# Patient Record
Sex: Female | Born: 1985 | Race: Black or African American | Hispanic: No | Marital: Single | Smoking: Never smoker
Health system: Southern US, Community
[De-identification: ages and names within clinical notes are randomized; demographics above are authoritative.]

## PROBLEM LIST (undated history)

## (undated) DIAGNOSIS — J45909 Unspecified asthma, uncomplicated: Secondary | ICD-10-CM

## (undated) DIAGNOSIS — E079 Disorder of thyroid, unspecified: Secondary | ICD-10-CM

## (undated) DIAGNOSIS — O009 Unspecified ectopic pregnancy without intrauterine pregnancy: Secondary | ICD-10-CM

## (undated) HISTORY — PX: OTHER SURGICAL HISTORY: SHX169

## (undated) HISTORY — PX: ECTOPIC PREGNANCY SURGERY: SHX613

---

## 2007-02-19 ENCOUNTER — Emergency Department (HOSPITAL_COMMUNITY): Admission: EM | Admit: 2007-02-19 | Discharge: 2007-02-19 | Payer: Self-pay | Admitting: Emergency Medicine

## 2007-03-24 ENCOUNTER — Encounter: Admission: RE | Admit: 2007-03-24 | Discharge: 2007-03-24 | Payer: Self-pay | Admitting: General Surgery

## 2007-03-24 ENCOUNTER — Encounter (INDEPENDENT_AMBULATORY_CARE_PROVIDER_SITE_OTHER): Payer: Self-pay | Admitting: Diagnostic Radiology

## 2007-03-24 ENCOUNTER — Other Ambulatory Visit: Admission: RE | Admit: 2007-03-24 | Discharge: 2007-03-24 | Payer: Self-pay | Admitting: Diagnostic Radiology

## 2008-05-26 ENCOUNTER — Emergency Department (HOSPITAL_COMMUNITY): Admission: EM | Admit: 2008-05-26 | Discharge: 2008-05-27 | Payer: Self-pay | Admitting: Emergency Medicine

## 2008-10-19 ENCOUNTER — Emergency Department (HOSPITAL_COMMUNITY): Admission: EM | Admit: 2008-10-19 | Discharge: 2008-10-19 | Payer: Self-pay | Admitting: Emergency Medicine

## 2008-11-21 ENCOUNTER — Emergency Department (HOSPITAL_COMMUNITY): Admission: EM | Admit: 2008-11-21 | Discharge: 2008-11-21 | Payer: Self-pay | Admitting: Emergency Medicine

## 2009-04-22 ENCOUNTER — Emergency Department (HOSPITAL_COMMUNITY): Admission: EM | Admit: 2009-04-22 | Discharge: 2009-04-22 | Payer: Self-pay | Admitting: Emergency Medicine

## 2009-10-30 ENCOUNTER — Emergency Department (HOSPITAL_COMMUNITY): Admission: EM | Admit: 2009-10-30 | Discharge: 2009-10-30 | Payer: Self-pay | Admitting: Family Medicine

## 2010-03-30 ENCOUNTER — Emergency Department (HOSPITAL_COMMUNITY)
Admission: EM | Admit: 2010-03-30 | Discharge: 2010-03-30 | Payer: Self-pay | Source: Home / Self Care | Admitting: Emergency Medicine

## 2010-06-02 ENCOUNTER — Emergency Department (HOSPITAL_COMMUNITY)
Admission: EM | Admit: 2010-06-02 | Discharge: 2010-06-02 | Disposition: A | Discharge status: Home / Self Care | Payer: No Typology Code available for payment source | Attending: Emergency Medicine | Admitting: Emergency Medicine

## 2010-06-02 DIAGNOSIS — M25519 Pain in unspecified shoulder: Secondary | ICD-10-CM | POA: Insufficient documentation

## 2010-06-02 DIAGNOSIS — IMO0002 Reserved for concepts with insufficient information to code with codable children: Secondary | ICD-10-CM | POA: Insufficient documentation

## 2010-06-02 DIAGNOSIS — R110 Nausea: Secondary | ICD-10-CM | POA: Insufficient documentation

## 2010-06-02 DIAGNOSIS — Y9289 Other specified places as the place of occurrence of the external cause: Secondary | ICD-10-CM | POA: Insufficient documentation

## 2010-10-15 ENCOUNTER — Emergency Department (HOSPITAL_COMMUNITY)
Admission: EM | Admit: 2010-10-15 | Discharge: 2010-10-15 | Discharge status: Against Medical Advice | Payer: Self-pay | Attending: Emergency Medicine | Admitting: Emergency Medicine

## 2011-07-09 ENCOUNTER — Emergency Department (HOSPITAL_COMMUNITY): Payer: Medicaid Other

## 2011-07-09 ENCOUNTER — Encounter (HOSPITAL_COMMUNITY): Payer: Self-pay | Admitting: *Deleted

## 2011-07-09 ENCOUNTER — Emergency Department (HOSPITAL_COMMUNITY)
Admission: EM | Admit: 2011-07-09 | Discharge: 2011-07-09 | Disposition: A | Discharge status: Home / Self Care | Payer: Medicaid Other | Attending: Emergency Medicine | Admitting: Emergency Medicine

## 2011-07-09 DIAGNOSIS — R079 Chest pain, unspecified: Secondary | ICD-10-CM | POA: Insufficient documentation

## 2011-07-09 DIAGNOSIS — E079 Disorder of thyroid, unspecified: Secondary | ICD-10-CM | POA: Insufficient documentation

## 2011-07-09 DIAGNOSIS — M940 Chondrocostal junction syndrome [Tietze]: Secondary | ICD-10-CM | POA: Insufficient documentation

## 2011-07-09 HISTORY — DX: Disorder of thyroid, unspecified: E07.9

## 2011-07-09 MED ORDER — NAPROXEN 500 MG PO TABS: 500.0000 mg | ORAL_TABLET | Freq: Two times a day (BID) | ORAL | Status: DC

## 2011-07-09 MED ORDER — IBUPROFEN 800 MG PO TABS
800.0000 mg | ORAL_TABLET | Freq: Once | ORAL | Status: AC
  Administered 2011-07-09: 800 mg via ORAL
  Filled 2011-07-09: qty 1

## 2011-07-09 MED ORDER — OXYCODONE-ACETAMINOPHEN 5-325 MG PO TABS: 1.0000 | ORAL_TABLET | ORAL | Status: AC | PRN

## 2011-07-09 NOTE — ED Notes (Signed)
Pt was sitting on couch last nite and started feeling pains in chest.  Pt has pain with moving around.  Pt has tenderness to right chest.  No coughing or lifting

## 2011-07-09 NOTE — ED Provider Notes (Signed)
History  This chart was scribed for Dione Booze, MD found by Bennett Scrape. This patient was seen in room STRE6/STRE6 and the patient's care was started at 12:25PM.  CSN: [id number]  Arrival date & time 07/09/11  0948   None     Chief Complaint  Patient presents with  . Chest Pain     The history is provided by the patient. No language interpreter was used.    Kaylee Walter is a 26 y.o. female who presents to the Emergency Department complaining of approximately 14 hours of gradual onset, non-changing, constant, non-radiating mid sternal area chest pains described as sharp. The pain is worse with deep breathes and lifting of arms. The pain is a 4 currently. She did not take any medications at home to improve symptoms. She denies any prior episodes. She denies any recent long travels or h/o DVTs/PEs. She is currently on a "birth control patch". She denies nausea, emesis, diaphoresis and SOB. She has a h/o thyroid disease.   She ha no PCP. She is on medicaid.   Past Medical History  Diagnosis Date  . Thyroid disease     Past Surgical History  Procedure Date  . Other surgical history     c-section  . Ectopic pregnancy surgery     No family history on file.  History  Substance Use Topics  . Smoking status: Never Smoker   . Smokeless tobacco: Not on file  . Alcohol Use: Yes     occ     Review of Systems  Constitutional: Negative for fever and diaphoresis.  Respiratory: Negative for shortness of breath.   Cardiovascular: Positive for chest pain. Negative for palpitations.  Gastrointestinal: Negative for nausea and vomiting.  Neurological: Negative for weakness.    Allergies  Review of patient's allergies indicates no known allergies.  Home Medications   Current Outpatient Rx  Name Route Sig Dispense Refill  . NORELGESTROMIN-ETH ESTRADIOL 150-20 MCG/24HR TD PTWK Transdermal Place 1 patch onto the skin once a week.      Triage Vitals: BP 112/85  Pulse  71  Temp(Src) 97.9 F (36.6 C) (Oral)  Resp 20  SpO2 95%  LMP 07/09/2011  Physical Exam  Nursing note and vitals reviewed. Constitutional: She is oriented to person, place, and time. She appears well-developed and well-nourished. No distress.  HENT:  Head: Normocephalic and atraumatic.  Eyes: EOM are normal.  Neck: Neck supple. No tracheal deviation present.  Cardiovascular: Normal rate.   Pulmonary/Chest: Effort normal. No respiratory distress. She exhibits tenderness.       Moderate tenderness to the upper right parasternal upon palpation  Musculoskeletal: Normal range of motion.  Neurological: She is alert and oriented to person, place, and time.  Skin: Skin is warm and dry.  Psychiatric: She has a normal mood and affect. Her behavior is normal.    ED Course  Procedures (including critical care time)  DIAGNOSTIC STUDIES: Oxygen Saturation is 95% on room air, adequate by my interpretation.    COORDINATION OF CARE: 12:29PM-Discussed negative radiology studies with pt and pt acknowledged the results. Discussed naproxen, tylenol, percocet and ice as discharge treatment plan with pt and pt agreed. Advised pt that the percocet will make her drowsy and constipate her.   Labs Reviewed - No data to display Dg Chest 2 View  07/09/2011  *RADIOLOGY REPORT*  Clinical Data: Chest pain  CHEST - 2 VIEW  Comparison: 11/21/2008  Findings: The heart size and mediastinal contours are within normal limits.  Both lungs are clear.  The visualized skeletal structures are unremarkable.  IMPRESSION: Negative examination.  Original Report Authenticated By: Rosealee Albee, M.D.     1. Costochondritis       MDM  Chest pain which seems most likely musculoskeletal.      I personally performed the services described in this documentation, which was scribed in my presence. The recorded information has been reviewed and considered.      Dione Booze, MD 07/14/11 [phone]

## 2011-07-09 NOTE — Discharge Instructions (Signed)
Costochondritis Costochondritis (Tietze syndrome), or costochondral separation, is a swelling and irritation (inflammation) of the tissue (cartilage) that connects your ribs with your breastbone (sternum). It may occur on its own (spontaneously), through damage caused by an accident (trauma), or simply from coughing or minor exercise. It may take up to 6 weeks to get better and longer if you are unable to be conservative in your activities. HOME CARE INSTRUCTIONS   Avoid exhausting physical activity. Try not to strain your ribs during normal activity. This would include any activities using chest, belly (abdominal), and side muscles, especially if heavy weights are used.   Use ice for 15 to 20 minutes per hour while awake for the first 2 days. Place the ice in a plastic bag, and place a towel between the bag of ice and your skin.   Only take over-the-counter or prescription medicines for pain, discomfort, or fever as directed by your caregiver.  SEEK IMMEDIATE MEDICAL CARE IF:   Your pain increases or you are very uncomfortable.   You have a fever.   You develop difficulty with your breathing.   You cough up blood.   You develop worse chest pains, shortness of breath, sweating, or vomiting.   You develop new, unexplained problems (symptoms).  MAKE SURE YOU:   Understand these instructions.   Will watch your condition.   Will get help right away if you are not doing well or get worse.  Document Released: 11/28/2004 Document Revised: 02/07/2011 Document Reviewed: 10/07/2007 [facility] Patient Information 2012 [place], [place].  Naproxen and naproxen sodium oral immediate-release tablets What is this medicine? NAPROXEN (na PROX en) is a non-steroidal anti-inflammatory drug (NSAID). It is used to reduce swelling and to treat pain. This medicine may be used for dental pain, headache, or painful monthly periods. It is also used for painful joint and muscular problems such as arthritis,  tendinitis, bursitis, and gout. This medicine may be used for other purposes; ask your health care provider or pharmacist if you have questions. What should I tell my health care provider before I take this medicine? They need to know if you have any of these conditions: -asthma -cigarette smoker -drink more than 3 alcohol containing drinks a day -heart disease or circulation problems such as heart failure or leg edema (fluid retention) -high blood pressure -kidney disease -liver disease -stomach bleeding or ulcers -an unusual or allergic reaction to naproxen, aspirin, other NSAIDs, other medicines, foods, dyes, or preservatives -pregnant or trying to get pregnant -breast-feeding How should I use this medicine? Take this medicine by mouth with a glass of water. Follow the directions on the prescription label. Take it with food if your stomach gets upset. Try to not lie down for at least 10 minutes after you take it. Take your medicine at regular intervals. Do not take your medicine more often than directed. Long-term, continuous use may increase the risk of heart attack or stroke. A special MedGuide will be given to you by the pharmacist with each prescription and refill. Be sure to read this information carefully each time. Talk to your pediatrician regarding the use of this medicine in children. Special care may be needed. Overdosage: If you think you have taken too much of this medicine contact a poison control center or emergency room at once. NOTE: This medicine is only for you. Do not share this medicine with others. What if I miss a dose? If you miss a dose, take it as soon as you can. If it  is almost time for your next dose, take only that dose. Do not take double or extra doses. What may interact with this medicine? -alcohol -aspirin -cidofovir -diuretics -lithium -methotrexate -other drugs for inflammation like ketorolac or prednisone -pemetrexed -probenecid -warfarin This  list may not describe all possible interactions. Give your health care provider a list of all the medicines, herbs, non-prescription drugs, or dietary supplements you use. Also tell them if you smoke, drink alcohol, or use illegal drugs. Some items may interact with your medicine. What should I watch for while using this medicine? Tell your doctor or health care professional if your pain does not get better. Talk to your doctor before taking another medicine for pain. Do not treat yourself. This medicine does not prevent heart attack or stroke. In fact, this medicine may increase the chance of a heart attack or stroke. The chance may increase with longer use of this medicine and in people who have heart disease. If you take aspirin to prevent heart attack or stroke, talk with your doctor or health care professional. Do not take other medicines that contain aspirin, ibuprofen, or naproxen with this medicine. Side effects such as stomach upset, nausea, or ulcers may be more likely to occur. Many medicines available without a prescription should not be taken with this medicine. This medicine can cause ulcers and bleeding in the stomach and intestines at any time during treatment. Do not smoke cigarettes or drink alcohol. These increase irritation to your stomach and can make it more susceptible to damage from this medicine. Ulcers and bleeding can happen without warning symptoms and can cause death. You may get drowsy or dizzy. Do not drive, use machinery, or do anything that needs mental alertness until you know how this medicine affects you. Do not stand or sit up quickly, especially if you are an older patient. This reduces the risk of dizzy or fainting spells. This medicine can cause you to bleed more easily. Try to avoid damage to your teeth and gums when you brush or floss your teeth. What side effects may I notice from receiving this medicine? Side effects that you should report to your doctor or health  care professional as soon as possible: -black or bloody stools, blood in the urine or vomit -blurred vision -chest pain -difficulty breathing or wheezing -nausea or vomiting -severe stomach pain -skin rash, skin redness, blistering or peeling skin, hives, or itching -slurred speech or weakness on one side of the body -swelling of eyelids, throat, lips -unexplained weight gain or swelling -unusually weak or tired -yellowing of eyes or skin Side effects that usually do not require medical attention (report to your doctor or health care professional if they continue or are bothersome): -constipation -headache -heartburn This list may not describe all possible side effects. Call your doctor for medical advice about side effects. You may report side effects to FDA at 1-800-FDA-1088. Where should I keep my medicine? Keep out of the reach of children. Store at room temperature between 15 and 30 degrees C (59 and 86 degrees F). Keep container tightly closed. Throw away any unused medicine after the expiration date. NOTE: This sheet is a summary. It may not cover all possible information. If you have questions about this medicine, talk to your doctor, pharmacist, or health care provider.  2012, Elsevier/Gold Standard. (02/20/2009 8:10:16 PM)  Acetaminophen; Oxycodone tablets What is this medicine? ACETAMINOPHEN; OXYCODONE (a set a MEE noe fen; ox i KOE done) is a pain reliever. It is  used to treat mild to moderate pain. This medicine may be used for other purposes; ask your health care provider or pharmacist if you have questions. What should I tell my health care provider before I take this medicine? They need to know if you have any of these conditions: -brain tumor -Crohn's disease, inflammatory bowel disease, or ulcerative colitis -drink more than 3 alcohol containing drinks per day -drug abuse or addiction -head injury -heart or circulation problems -kidney disease or problems going  to the bathroom -liver disease -lung disease, asthma, or breathing problems -an unusual or allergic reaction to acetaminophen, oxycodone, other opioid analgesics, other medicines, foods, dyes, or preservatives -pregnant or trying to get pregnant -breast-feeding How should I use this medicine? Take this medicine by mouth with a full glass of water. Follow the directions on the prescription label. Take your medicine at regular intervals. Do not take your medicine more often than directed. Talk to your pediatrician regarding the use of this medicine in children. Special care may be needed. Patients over 60 years old may have a stronger reaction and need a smaller dose. Overdosage: If you think you have taken too much of this medicine contact a poison control center or emergency room at once. NOTE: This medicine is only for you. Do not share this medicine with others. What if I miss a dose? If you miss a dose, take it as soon as you can. If it is almost time for your next dose, take only that dose. Do not take double or extra doses. What may interact with this medicine? -alcohol or medicines that contain alcohol -antihistamines -barbiturates like amobarbital, butalbital, butabarbital, methohexital, pentobarbital, phenobarbital, thiopental, and secobarbital -benztropine -drugs for bladder problems like solifenacin, trospium, oxybutynin, tolterodine, hyoscyamine, and methscopolamine -drugs for breathing problems like ipratropium and tiotropium -drugs for certain stomach or intestine problems like propantheline, homatropine methylbromide, glycopyrrolate, atropine, belladonna, and dicyclomine -general anesthetics like etomidate, ketamine, nitrous oxide, propofol, desflurane, enflurane, halothane, isoflurane, and sevoflurane -medicines for depression, anxiety, or psychotic disturbances -medicines for pain like codeine, morphine, pentazocine, buprenorphine, butorphanol, nalbuphine, tramadol, and  propoxyphene -medicines for sleep -muscle relaxants -naltrexone -phenothiazines like perphenazine, thioridazine, chlorpromazine, mesoridazine, fluphenazine, prochlorperazine, promazine, and trifluoperazine -scopolamine -trihexyphenidyl This list may not describe all possible interactions. Give your health care provider a list of all the medicines, herbs, non-prescription drugs, or dietary supplements you use. Also tell them if you smoke, drink alcohol, or use illegal drugs. Some items may interact with your medicine. What should I watch for while using this medicine? Tell your doctor or health care professional if your pain does not go away, if it gets worse, or if you have new or a different type of pain. You may develop tolerance to the medicine. Tolerance means that you will need a higher dose of the medication for pain relief. Tolerance is normal and is expected if you take this medicine for a long time. Do not suddenly stop taking your medicine because you may develop a severe reaction. Your body becomes used to the medicine. This does NOT mean you are addicted. Addiction is a behavior related to getting and using a drug for a nonmedical reason. If you have pain, you have a medical reason to take pain medicine. Your doctor will tell you how much medicine to take. If your doctor wants you to stop the medicine, the dose will be slowly lowered over time to avoid any side effects. You may get drowsy or dizzy. Do not drive, use machinery,  or do anything that needs mental alertness until you know how this medicine affects you. Do not stand or sit up quickly, especially if you are an older patient. This reduces the risk of dizzy or fainting spells. Alcohol may interfere with the effect of this medicine. Avoid alcoholic drinks. The medicine will cause constipation. Try to have a bowel movement at least every 2 to 3 days. If you do not have a bowel movement for 3 days, call your doctor or health care  professional. Do not take Tylenol (acetaminophen) or medicines that have acetaminophen with this medicine. Too much acetaminophen can be very dangerous. Many nonprescription medicines contain acetaminophen. Always read the labels carefully to avoid taking more acetaminophen. What side effects may I notice from receiving this medicine? Side effects that you should report to your doctor or health care professional as soon as possible: -allergic reactions like skin rash, itching or hives, swelling of the face, lips, or tongue -breathing difficulties, wheezing -confusion -light headedness or fainting spells -severe stomach pain -yellowing of the skin or the whites of the eyes Side effects that usually do not require medical attention (report to your doctor or health care professional if they continue or are bothersome): -dizziness -drowsiness -nausea -vomiting This list may not describe all possible side effects. Call your doctor for medical advice about side effects. You may report side effects to FDA at 1-800-FDA-1088. Where should I keep my medicine? Keep out of the reach of children. This medicine can be abused. Keep your medicine in a safe place to protect it from theft. Do not share this medicine with anyone. Selling or giving away this medicine is dangerous and against the law. Store at room temperature between 20 and 25 degrees C (68 and 77 degrees F). Keep container tightly closed. Protect from light. Flush any unused medicines down the toilet. Do not use the medicine after the expiration date. NOTE: This sheet is a summary. It may not cover all possible information. If you have questions about this medicine, talk to your doctor, pharmacist, or health care provider.  2012, [employer]vier/Gold Standard. (01/18/2008 10:01:21 AM)

## 2011-11-10 ENCOUNTER — Encounter (HOSPITAL_COMMUNITY): Payer: Self-pay

## 2011-11-10 ENCOUNTER — Emergency Department (HOSPITAL_COMMUNITY)
Admission: EM | Admit: 2011-11-10 | Discharge: 2011-11-10 | Disposition: A | Discharge status: Home / Self Care | Payer: Medicaid Other | Source: Home / Self Care | Attending: Emergency Medicine | Admitting: Emergency Medicine

## 2011-11-10 DIAGNOSIS — L259 Unspecified contact dermatitis, unspecified cause: Secondary | ICD-10-CM

## 2011-11-10 HISTORY — DX: Unspecified ectopic pregnancy without intrauterine pregnancy: O00.90

## 2011-11-10 MED ORDER — BETAMETHASONE VALERATE 0.12 % EX FOAM: 1.0000 "application " | Freq: Two times a day (BID) | CUTANEOUS | Status: DC

## 2011-11-10 MED ORDER — METHYLPREDNISOLONE 4 MG PO KIT: PACK | ORAL | Status: AC

## 2011-11-10 MED ORDER — LORATADINE 10 MG PO TABS: 10.0000 mg | ORAL_TABLET | Freq: Every day | ORAL | Status: DC

## 2011-11-10 NOTE — ED Notes (Signed)
Pt used conditioner on her hair today and now has swelling and rash of her face and scalp.

## 2011-11-13 NOTE — ED Provider Notes (Signed)
History     CSN: [id number]  Arrival date & time 11/10/11  1508   First MD Initiated Contact with Patient 11/10/11 1537      Chief Complaint  Patient presents with  . Dermatitis    (Consider location/radiation/quality/duration/timing/severity/associated sxs/prior treatment) HPI Comments: Patient states that she is a new conditioner on her hair earlier today, now reports dry, irritated skin, burning pain, swelling, itching along entire scalp and on her hairline.   Patient is a 26 y.o. female presenting with rash. The history is provided by the patient. No language interpreter was used.  Rash  This is a new problem. The current episode started 3 to 5 hours ago. The problem has not changed since onset.The problem is associated with chemical exposure. There has been no fever. The rash is present on the scalp. Associated symptoms include itching and pain. Pertinent negatives include no blisters and no weeping. She has tried nothing for the symptoms. The treatment provided no relief. Risk factors include new environmental exposures.    Past Medical History  Diagnosis Date  . Thyroid disease   . Ectopic pregnancy     Past Surgical History  Procedure Date  . Other surgical history     c-section  . Ectopic pregnancy surgery   . Cesarean section     History reviewed. No pertinent family history.  History  Substance Use Topics  . Smoking status: Never Smoker   . Smokeless tobacco: Not on file  . Alcohol Use: Yes     occ    OB History    Grav Para Term Preterm Abortions TAB SAB Ect Mult Living                  Review of Systems  Skin: Positive for itching and rash.    Allergies  Review of patient's allergies indicates no known allergies.  Home Medications   Current Outpatient Rx  Name Route Sig Dispense Refill  . BETAMETHASONE VALERATE 0.12 % EX FOAM Topical Apply 1 application topically 2 (two) times daily. Until symptoms resolve 100 g 0  . LORATADINE 10 MG PO TABS  Oral Take 1 tablet (10 mg total) by mouth daily. 10 tablet 0  . METHYLPREDNISOLONE 4 MG PO KIT  follow package directions 21 tablet 0    BP 129/81  Pulse 82  Temp 98 F (36.7 C) (Oral)  Resp 18  SpO2 96%  LMP 10/21/2011  Physical Exam  Nursing note and vitals reviewed. Constitutional: She is oriented to person, place, and time. She appears well-developed and well-nourished. No distress.  HENT:  Head: Normocephalic and atraumatic.  Eyes: Conjunctivae normal and EOM are normal.  Neck: Normal range of motion.  Cardiovascular: Normal rate.   Pulmonary/Chest: Effort normal.  Abdominal: She exhibits no distension.  Musculoskeletal: Normal range of motion.  Lymphadenopathy:    She has no cervical adenopathy.  Neurological: She is alert and oriented to person, place, and time. Coordination normal.  Skin: Skin is warm and dry. Rash noted.       Diffusely tender, erythematous, irritated scalp. No signs of infection  Psychiatric: She has a normal mood and affect. Her behavior is normal. Judgment and thought content normal.    ED Course  Procedures (including critical care time)  Labs Reviewed - No data to display No results found.   1. Contact dermatitis     MDM  No signs of infection. Topical steroids for scalp, nonsedating antihistamines, Medrol Dosepak. Discussed signs and symptoms that should  prompt return to the department. Patient agrees with plan  Luiz Blare, MD 11/13/11 [phone]

## 2011-11-19 ENCOUNTER — Encounter (HOSPITAL_COMMUNITY): Payer: Self-pay | Admitting: Emergency Medicine

## 2011-11-19 ENCOUNTER — Emergency Department (HOSPITAL_COMMUNITY)
Admission: EM | Admit: 2011-11-19 | Discharge: 2011-11-19 | Disposition: A | Discharge status: Home / Self Care | Payer: Self-pay | Attending: Emergency Medicine | Admitting: Emergency Medicine

## 2011-11-19 ENCOUNTER — Emergency Department (HOSPITAL_COMMUNITY): Payer: Self-pay

## 2011-11-19 DIAGNOSIS — Z3490 Encounter for supervision of normal pregnancy, unspecified, unspecified trimester: Secondary | ICD-10-CM

## 2011-11-19 DIAGNOSIS — J069 Acute upper respiratory infection, unspecified: Secondary | ICD-10-CM | POA: Insufficient documentation

## 2011-11-19 DIAGNOSIS — E079 Disorder of thyroid, unspecified: Secondary | ICD-10-CM | POA: Insufficient documentation

## 2011-11-19 DIAGNOSIS — Z3201 Encounter for pregnancy test, result positive: Secondary | ICD-10-CM | POA: Insufficient documentation

## 2011-11-19 DIAGNOSIS — B9789 Other viral agents as the cause of diseases classified elsewhere: Secondary | ICD-10-CM | POA: Insufficient documentation

## 2011-11-19 MED ORDER — PRENATAL COMPLETE 14-0.4 MG PO TABS: 1.0000 | ORAL_TABLET | Freq: Every day | ORAL | Status: DC

## 2011-11-19 NOTE — ED Notes (Signed)
Pt here for cold symptoms, and cough, sorethroat, congestion and runy nose. sts she also had a light period last cycle and may be pregnant.

## 2011-11-19 NOTE — ED Notes (Signed)
Pt c/o URI and cough with cold and productive cough with yellow sputum; pt sts pain in chest with cough; pt sts started yesterday with sore throat also

## 2011-11-19 NOTE — ED Provider Notes (Signed)
History  This chart was scribed for Joya Gaskins, MD by Ladona Ridgel Day. This patient was seen in room TR08C/TR08C and the patient's care was started at 0910.   CSN: [id number]  Arrival date & time 11/19/11  0910   First MD Initiated Contact with Patient 11/19/11 1103      Chief Complaint  Patient presents with  . URI  . Cough   Patient is a 26 y.o. female presenting with cough. The history is provided by the patient. No language interpreter was used.  Cough This is a new problem. The current episode started yesterday. The problem occurs constantly. The problem has been gradually worsening. The cough is productive of sputum. There has been no fever. Associated symptoms include sore throat and myalgias. Pertinent negatives include no shortness of breath. Treatments tried: mucin ex. The treatment provided no relief. She is not a smoker.   Kaylee Walter is a 27 y.o. female who presents to the Emergency Department complaining of constant gradually worsening productive cough with yellow sputum which began yesterday. She also complains of emesis, body aches, dizziness, and a sore throat. Tried taking mucin ex without any relief. She has no abdominal pain or vaginal bleeding.  Past Medical History  Diagnosis Date  . Thyroid disease   . Ectopic pregnancy     Past Surgical History  Procedure Date  . Other surgical history     c-section  . Ectopic pregnancy surgery   . Cesarean section     History reviewed. No pertinent family history.  History  Substance Use Topics  . Smoking status: Never Smoker   . Smokeless tobacco: Not on file  . Alcohol Use: Yes     occ    OB History    Grav Para Term Preterm Abortions TAB SAB Ect Mult Living                  Review of Systems  Constitutional: Negative for fever.  HENT: Positive for sore throat.   Respiratory: Positive for cough and chest tightness. Negative for shortness of breath.   Gastrointestinal: Positive for nausea and  vomiting. Negative for abdominal pain and diarrhea.  Genitourinary: Negative for vaginal bleeding.  Musculoskeletal: Positive for myalgias.  Neurological: Positive for dizziness.    Allergies  Review of patient's allergies indicates no known allergies.  Home Medications  No current outpatient prescriptions on file.  Triage Vitals: BP 116/68  Pulse 111  Temp 98.3 F (36.8 C) (Oral)  Resp 20  SpO2 99%  LMP 10/21/2011  Physical Exam CONSTITUTIONAL: Well developed/well nourished HEAD AND FACE: Normocephalic/atraumatic EYES: EOMI/PERRL ENMT: Mucous membranes moist, uvula midline, voice normal, left TM/right TM normal NECK: supple no meningeal signs SPINE:entire spine nontender CV: S1/S2 noted, no murmurs/rubs/gallops noted LUNGS: Lungs are clear to auscultation bilaterally, no apparent distress ABDOMEN: soft, nontender, no rebound or guarding GU:no cva tenderness NEURO: Pt is awake/alert, moves all extremitiesx4 EXTREMITIES: pulses normal, full ROM SKIN: warm, color normal PSYCH: no abnormalities of mood noted ED Course  Procedures  DIAGNOSTIC STUDIES: Oxygen Saturation is 99% on room air, normal by my interpretation.    COORDINATION OF CARE: At 1115 AM Discussed treatment plan with patient which includes CXR, and UA. Patient agrees.   Labs Reviewed  POCT PREGNANCY, URINE - Abnormal; Notable for the following:    Preg Test, Ur POSITIVE (*)     All other components within normal limits   Dg Chest 2 View  11/19/2011  *RADIOLOGY REPORT*  Clinical  Data: Chest pain, cough, congestion  CHEST - 2 VIEW  Comparison: 07/09/2011  Findings: Cardiomediastinal silhouette is stable.  No acute infiltrate or pleural effusion.  No pulmonary edema.  Bony thorax is stable.  IMPRESSION: No active disease.   Original Report Authenticated By: Natasha Mead, M.D.     Pt advised to use tylenol   Prenatal vitamins ordered Likely has viral uri.     MDM  Nursing notes including past medical  history and social history reviewed and considered in documentation xrays reviewed and considered    I personally performed the services described in this documentation, which was scribed in my presence. The recorded information has been reviewed and considered.           Joya Gaskins, MD 11/19/11 [phone]

## 2012-10-06 ENCOUNTER — Encounter (HOSPITAL_COMMUNITY): Payer: Self-pay | Admitting: Emergency Medicine

## 2012-10-06 ENCOUNTER — Emergency Department (HOSPITAL_COMMUNITY): Payer: BC Managed Care – PPO

## 2012-10-06 ENCOUNTER — Emergency Department (HOSPITAL_COMMUNITY)
Admission: EM | Admit: 2012-10-06 | Discharge: 2012-10-06 | Disposition: A | Discharge status: Home / Self Care | Payer: BC Managed Care – PPO | Attending: Emergency Medicine | Admitting: Emergency Medicine

## 2012-10-06 DIAGNOSIS — M545 Low back pain, unspecified: Secondary | ICD-10-CM

## 2012-10-06 DIAGNOSIS — E079 Disorder of thyroid, unspecified: Secondary | ICD-10-CM | POA: Insufficient documentation

## 2012-10-06 DIAGNOSIS — Y9389 Activity, other specified: Secondary | ICD-10-CM | POA: Insufficient documentation

## 2012-10-06 DIAGNOSIS — Z79899 Other long term (current) drug therapy: Secondary | ICD-10-CM | POA: Insufficient documentation

## 2012-10-06 DIAGNOSIS — Z3202 Encounter for pregnancy test, result negative: Secondary | ICD-10-CM | POA: Insufficient documentation

## 2012-10-06 DIAGNOSIS — Z8742 Personal history of other diseases of the female genital tract: Secondary | ICD-10-CM | POA: Insufficient documentation

## 2012-10-06 DIAGNOSIS — Y92410 Unspecified street and highway as the place of occurrence of the external cause: Secondary | ICD-10-CM | POA: Insufficient documentation

## 2012-10-06 LAB — URINALYSIS, ROUTINE W REFLEX MICROSCOPIC
Glucose, UA: NEGATIVE mg/dL
Hgb urine dipstick: NEGATIVE
Ketones, ur: 15 mg/dL — AB
Specific Gravity, Urine: 1.031 — ABNORMAL HIGH (ref 1.005–1.030)

## 2012-10-06 LAB — URINE MICROSCOPIC-ADD ON

## 2012-10-06 MED ORDER — HYDROCODONE-ACETAMINOPHEN 5-325 MG PO TABS: 1.0000 | ORAL_TABLET | Freq: Four times a day (QID) | ORAL | Status: DC | PRN

## 2012-10-06 MED ORDER — IBUPROFEN 800 MG PO TABS: 800.0000 mg | ORAL_TABLET | Freq: Three times a day (TID) | ORAL | Status: DC

## 2012-10-06 MED ORDER — CYCLOBENZAPRINE HCL 10 MG PO TABS: 10.0000 mg | ORAL_TABLET | Freq: Two times a day (BID) | ORAL | Status: DC | PRN
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
## 2012-10-08 LAB — URINE CULTURE

## 2012-10-09 NOTE — ED Provider Notes (Signed)
Medical screening examination/treatment/procedure(s) were performed by non-physician practitioner and as supervising physician I was immediately available for consultation/collaboration.   Jerre Vandrunen W. Evalisse Prajapati, MD 10/09/12 2225 

## 2012-11-13 ENCOUNTER — Other Ambulatory Visit: Payer: Self-pay | Admitting: Obstetrics and Gynecology

## 2012-11-13 ENCOUNTER — Other Ambulatory Visit (HOSPITAL_COMMUNITY)
Admission: RE | Admit: 2012-11-13 | Discharge: 2012-11-13 | Disposition: A | Discharge status: Home / Self Care | Payer: BC Managed Care – PPO | Source: Ambulatory Visit | Attending: Obstetrics and Gynecology | Admitting: Obstetrics and Gynecology

## 2012-11-13 DIAGNOSIS — Z01419 Encounter for gynecological examination (general) (routine) without abnormal findings: Secondary | ICD-10-CM | POA: Insufficient documentation

## 2013-06-09 ENCOUNTER — Emergency Department (HOSPITAL_COMMUNITY)
Admission: EM | Admit: 2013-06-09 | Discharge: 2013-06-09 | Disposition: A | Discharge status: Home / Self Care | Payer: BC Managed Care – PPO | Attending: Emergency Medicine | Admitting: Emergency Medicine

## 2013-06-09 ENCOUNTER — Encounter (HOSPITAL_COMMUNITY): Payer: Self-pay | Admitting: Emergency Medicine

## 2013-06-09 ENCOUNTER — Emergency Department (HOSPITAL_COMMUNITY): Payer: BC Managed Care – PPO

## 2013-06-09 DIAGNOSIS — J45901 Unspecified asthma with (acute) exacerbation: Secondary | ICD-10-CM | POA: Insufficient documentation

## 2013-06-09 DIAGNOSIS — R5383 Other fatigue: Secondary | ICD-10-CM

## 2013-06-09 DIAGNOSIS — R0789 Other chest pain: Secondary | ICD-10-CM | POA: Insufficient documentation

## 2013-06-09 DIAGNOSIS — J45909 Unspecified asthma, uncomplicated: Secondary | ICD-10-CM

## 2013-06-09 DIAGNOSIS — Z8639 Personal history of other endocrine, nutritional and metabolic disease: Secondary | ICD-10-CM | POA: Insufficient documentation

## 2013-06-09 DIAGNOSIS — R5381 Other malaise: Secondary | ICD-10-CM | POA: Insufficient documentation

## 2013-06-09 DIAGNOSIS — Z862 Personal history of diseases of the blood and blood-forming organs and certain disorders involving the immune mechanism: Secondary | ICD-10-CM | POA: Insufficient documentation

## 2013-06-09 DIAGNOSIS — Z79899 Other long term (current) drug therapy: Secondary | ICD-10-CM | POA: Insufficient documentation

## 2013-06-09 DIAGNOSIS — Z791 Long term (current) use of non-steroidal anti-inflammatories (NSAID): Secondary | ICD-10-CM | POA: Insufficient documentation

## 2013-06-09 DIAGNOSIS — R42 Dizziness and giddiness: Secondary | ICD-10-CM | POA: Insufficient documentation

## 2013-06-09 DIAGNOSIS — J22 Unspecified acute lower respiratory infection: Secondary | ICD-10-CM

## 2013-06-09 HISTORY — DX: Unspecified asthma, uncomplicated: J45.909

## 2013-06-09 MED ORDER — PREDNISONE 20 MG PO TABS: 40.0000 mg | ORAL_TABLET | Freq: Every day | ORAL | Status: DC

## 2013-06-09 MED ORDER — ALBUTEROL SULFATE (2.5 MG/3ML) 0.083% IN NEBU
5.0000 mg | INHALATION_SOLUTION | Freq: Once | RESPIRATORY_TRACT | Status: AC
  Administered 2013-06-09: 5 mg via RESPIRATORY_TRACT
  Filled 2013-06-09: qty 6

## 2013-06-09 MED ORDER — BUDESONIDE 180 MCG/ACT IN AEPB: 1.0000 | INHALATION_SPRAY | Freq: Two times a day (BID) | RESPIRATORY_TRACT | Status: DC

## 2013-06-09 MED ORDER — IPRATROPIUM BROMIDE 0.02 % IN SOLN
0.5000 mg | Freq: Once | RESPIRATORY_TRACT | Status: AC
  Administered 2013-06-09: 0.5 mg via RESPIRATORY_TRACT
  Filled 2013-06-09: qty 2.5

## 2013-06-09 MED ORDER — ALBUTEROL SULFATE HFA 108 (90 BASE) MCG/ACT IN AERS: 1.0000 | INHALATION_SPRAY | RESPIRATORY_TRACT | Status: DC | PRN

## 2013-06-09 NOTE — ED Notes (Signed)
Pt states she has asthma.  Pt states she has been having coughing spells to the point she makes herself dizzy.  Pt states she took 2 puffs of her albuterol inhaler with a little relief.  Pt with dry cough noted in triage.  Pt states she also took allergy medicine today.

## 2013-06-09 NOTE — Discharge Instructions (Signed)
Begin using your pulmicort inhaler for asthma prevention after you finish your course of prednisone. Make sure to wash your mouth out with water when you are finished using the pulmicort every time.    Read the instructions below on reasons to return to the emergency department and to learn more about your diagnosis.  Use over the counter medications for symptomatic relief as we discussed (musinex as a decongestant, Tylenol for fever/pain, Motrin/Ibuprofen for muscle aches). If prescribed a cough suppressant during your visit, do not operate heavy machinery with in 5 hours of taking this medication. Followup with your primary care doctor in 4 days if your symptoms persist.  Your more than welcome to return to the emergency department if symptoms worsen or become concerning.  Upper Respiratory Infection, Adult  An upper respiratory infection (URI) is also sometimes known as the common cold. Most people improve within 1 week, but symptoms can last up to 2 weeks. A residual cough may last even longer.   URI is most commonly caused by a virus. Viruses are NOT treated with antibiotics. You can easily spread the virus to others by oral contact. This includes kissing, sharing a glass, coughing, or sneezing. Touching your mouth or nose and then touching a surface, which is then touched by another person, can also spread the virus.   TREATMENT  Treatment is directed at relieving symptoms. There is no cure. Antibiotics are not effective, because the infection is caused by a virus, not by bacteria. Treatment may include:  Increased fluid intake. Sports drinks offer valuable electrolytes, sugars, and fluids.  Breathing heated mist or steam (vaporizer or shower).  Eating chicken soup or other clear broths, and maintaining good nutrition.  Getting plenty of rest.  Using gargles or lozenges for comfort.  Controlling fevers with ibuprofen or acetaminophen as directed by your caregiver.  Increasing usage of your  inhaler if you have asthma.  Return to work when your temperature has returned to normal.   SEEK MEDICAL CARE IF:  After the first few days, you feel you are getting worse rather than better.  You develop worsening shortness of breath, or brown or red sputum. These may be signs of pneumonia.  You develop yellow or brown nasal discharge or pain in the face, especially when you bend forward. These may be signs of sinusitis.  You develop a fever, swollen neck glands, pain with swallowing, or white areas in the back of your throat. These may be signs of strep throat.    Bronchitis Bronchitis is inflammation of the airways that extend from the windpipe into the lungs (bronchi). The inflammation often causes mucus to develop, which leads to a cough. If the inflammation becomes severe, it may cause shortness of breath. CAUSES  Bronchitis may be caused by:   Viral infections.   Bacteria.   Cigarette smoke.   Allergens, pollutants, and other irritants.  SIGNS AND SYMPTOMS  The most common symptom of bronchitis is a frequent cough that produces mucus. Other symptoms include:  Fever.   Body aches.   Chest congestion.   Chills.   Shortness of breath.   Sore throat.  DIAGNOSIS  Bronchitis is usually diagnosed through a medical history and physical exam. Tests, such as chest X-rays, are sometimes done to rule out other conditions.  TREATMENT  You may need to avoid contact with whatever caused the problem (smoking, for example). Medicines are sometimes needed. These may include:  Antibiotics. These may be prescribed if the condition is caused  by bacteria.  Cough suppressants. These may be prescribed for relief of cough symptoms.   Inhaled medicines. These may be prescribed to help open your airways and make it easier for you to breathe.   Steroid medicines. These may be prescribed for those with recurrent (chronic) bronchitis. HOME CARE INSTRUCTIONS  Get plenty of rest.    Drink enough fluids to keep your urine clear or pale yellow (unless you have a medical condition that requires fluid restriction). Increasing fluids may help thin your secretions and will prevent dehydration.   Only take over-the-counter or prescription medicines as directed by your health care provider.  Only take antibiotics as directed. Make sure you finish them even if you start to feel better.  Avoid secondhand smoke, irritating chemicals, and strong fumes. These will make bronchitis worse. If you are a smoker, quit smoking. Consider using nicotine gum or skin patches to help control withdrawal symptoms. Quitting smoking will help your lungs heal faster.   Put a cool-mist humidifier in your bedroom at night to moisten the air. This may help loosen mucus. Change the water in the humidifier daily. You can also run the hot water in your shower and sit in the bathroom with the door closed for 5 10 minutes.   Follow up with your health care provider as directed.   Wash your hands frequently to avoid catching bronchitis again or spreading an infection to others.  SEEK MEDICAL CARE IF: Your symptoms do not improve after 1 week of treatment.  SEEK IMMEDIATE MEDICAL CARE IF:  Your fever increases.  You have chills.   You have chest pain.   You have worsening shortness of breath.   You have bloody sputum.  You faint.  You have lightheadedness.  You have a severe headache.   You vomit repeatedly. MAKE SURE YOU:   Understand these instructions.  Will watch your condition.  Will get help right away if you are not doing well or get worse. Document Released: 02/18/2005 Document Revised: 12/09/2012 Document Reviewed: 10/13/2012 [facility] Patient Information 2014 [place], [place].  Asthma Attack Prevention Although there is no way to prevent asthma from starting, you can take steps to control the disease and reduce its symptoms. Learn about your asthma and how to  control it. Take an active role to control your asthma by working with your health care provider to create and follow an asthma action plan. An asthma action plan guides you in:  Taking your medicines properly.  Avoiding things that set off your asthma or make your asthma worse (asthma triggers).  Tracking your level of asthma control.  Responding to worsening asthma.  Seeking emergency care when needed. To track your asthma, keep records of your symptoms, check your peak flow number using a handheld device that shows how well air moves out of your lungs (peak flow meter), and get regular asthma checkups.  WHAT ARE SOME WAYS TO PREVENT AN ASTHMA ATTACK?  Take medicines as directed by your health care provider.  Keep track of your asthma symptoms and level of control.  With your health care provider, write a detailed plan for taking medicines and managing an asthma attack. Then be sure to follow your action plan. Asthma is an ongoing condition that needs regular monitoring and treatment.  Identify and avoid asthma triggers. Many outdoor allergens and irritants (such as pollen, mold, cold air, and air pollution) can trigger asthma attacks. Find out what your asthma triggers are and take steps to avoid them.  Monitor your breathing. Learn to recognize warning signs of an attack, such as coughing, wheezing, or shortness of breath. Your lung function may decrease before you notice any signs or symptoms, so regularly measure and record your peak airflow with a home peak flow meter.  Identify and treat attacks early. If you act quickly, you are less likely to have a severe attack. You will also need less medicine to control your symptoms. When your peak flow measurements decrease and alert you to an upcoming attack, take your medicine as instructed and immediately stop any activity that may have triggered the attack. If your symptoms do not improve, get medical help.  Pay attention to increasing  quick-relief inhaler use. If you find yourself relying on your quick-relief inhaler, your asthma is not under control. See your health care provider about adjusting your treatment. WHAT CAN MAKE MY SYMPTOMS WORSE? A number of common things can set off or make your asthma symptoms worse and cause temporary increased inflammation of your airways. Keep track of your asthma symptoms for several weeks, detailing all the environmental and emotional factors that are linked with your asthma. When you have an asthma attack, go back to your asthma diary to see which factor, or combination of factors, might have contributed to it. Once you know what these factors are, you can take steps to control many of them. If you have allergies and asthma, it is important to take asthma prevention steps at home. Minimizing contact with the substance to which you are allergic will help prevent an asthma attack. Some triggers and ways to avoid these triggers are: Animal Dander:  Some people are allergic to the flakes of skin or dried saliva from animals with fur or feathers.   There is no such thing as a hypoallergenic dog or cat breed. All dogs or cats can cause allergies, even if they don't shed.  Keep these pets out of your home.  If you are not able to keep a pet outdoors, keep the pet out of your bedroom and other sleeping areas at all times, and keep the door closed.  Remove carpets and furniture covered with cloth from your home. If that is not possible, keep the pet away from fabric-covered furniture and carpets. Dust Mites: Many people with asthma are allergic to dust mites. Dust mites are tiny bugs that are found in every home in mattresses, pillows, carpets, fabric-covered furniture, bedcovers, clothes, stuffed toys, and other fabric-covered items.   Cover your mattress in a special dust-proof cover.  Cover your pillow in a special dust-proof cover, or wash the pillow each week in hot water. Water must be hotter  than 130 F (54.4 C) to kill dust mites. Cold or warm water used with detergent and bleach can also be effective.  Wash the sheets and blankets on your bed each week in hot water.  Try not to sleep or lie on cloth-covered cushions.  Call ahead when traveling and ask for a smoke-free hotel room. Bring your own bedding and pillows in case the hotel only supplies feather pillows and down comforters, which may contain dust mites and cause asthma symptoms.  Remove carpets from your bedroom and those laid on concrete, if you can.  Keep stuffed toys out of the bed, or wash the toys weekly in hot water or cooler water with detergent and bleach. Cockroaches: Many people with asthma are allergic to the droppings and remains of cockroaches.   Keep food and garbage in closed containers.  Never leave food out.  Use poison baits, traps, powders, gels, or paste (for example, boric acid).  If a spray is used to kill cockroaches, stay out of the room until the odor goes away. Indoor Mold:  Fix leaky faucets, pipes, or other sources of water that have mold around them.  Clean floors and moldy surfaces with a fungicide or diluted bleach.  Avoid using humidifiers, vaporizers, or swamp coolers. These can spread molds through the air. Pollen and Outdoor Mold:  When pollen or mold spore counts are high, try to keep your windows closed.  Stay indoors with windows closed from late morning to afternoon. Pollen and some mold spore counts are highest at that time.  Ask your health care provider whether you need to take anti-inflammatory medicine or increase your dose of the medicine before your allergy season starts. Other Irritants to Avoid:  Tobacco smoke is an irritant. If you smoke, ask your health care provider how you can quit. Ask family members to quit smoking too. Do not allow smoking in your home or car.  If possible, do not use a wood-burning stove, kerosene heater, or fireplace. Minimize  exposure to all sources of smoke, including to incense, candles, fires, and fireworks.  Try to stay away from strong odors and sprays, such as perfume, talcum powder, hair spray, and paints.  Decrease humidity in your home and use an indoor air cleaning device. Reduce indoor humidity to below 60%. Dehumidifiers or central air conditioners can do this.  Decrease house dust exposure by changing furnace and air cooler filters frequently.  Try to have someone else vacuum for you once or twice a week. Stay out of rooms while they are being vacuumed and for a short while afterward.  If you vacuum, use a dust mask from a hardware store, a double-layered or microfilter vacuum cleaner bag, or a vacuum cleaner with a HEPA filter.  Sulfites in foods and beverages can be irritants. Do not drink beer or wine or eat dried fruit, processed potatoes, or shrimp if they cause asthma symptoms.  Cold air can trigger an asthma attack. Cover your nose and mouth with a scarf on cold or windy days.  Several health conditions can make asthma more difficult to manage, including a runny nose, sinus infections, reflux disease, psychological stress, and sleep apnea. Work with your health care provider to manage these conditions.  Avoid close contact with people who have a respiratory infection such as a cold or the flu, since your asthma symptoms may get worse if you catch the infection. Wash your hands thoroughly after touching items that may have been handled by people with a respiratory infection.  Get a flu shot every year to protect against the flu virus, which often makes asthma worse for days or weeks. Also get a pneumonia shot if you have not previously had one. Unlike the flu shot, the pneumonia shot does not need to be given yearly. Medicines:  Talk to your health care provider about whether it is safe for you to take aspirin or non-steroidal anti-inflammatory medicines (NSAIDs). In a small number of people with  asthma, aspirin and NSAIDs can cause asthma attacks. These medicines must be avoided by people who have known aspirin-sensitive asthma. It is important that people with aspirin-sensitive asthma read labels of all over-the-counter medicines used to treat pain, colds, coughs, and fever.  Beta blockers and ACE inhibitors are other medicines you should discuss with your health care provider. HOW CAN I FIND  OUT WHAT I AM ALLERGIC TO? Ask your asthma health care provider about allergy skin testing or blood testing (the RAST test) to identify the allergens to which you are sensitive. If you are found to have allergies, the most important thing to do is to try to avoid exposure to any allergens that you are sensitive to as much as possible. Other treatments for allergies, such as medicines and allergy shots (immunotherapy) are available.  CAN I EXERCISE? Follow your health care provider's advice regarding asthma treatment before exercising. It is important to maintain a regular exercise program, but vigorous exercise, or exercise in cold, humid, or dry environments can cause asthma attacks, especially for those people who have exercise-induced asthma. Document Released: 02/06/2009 Document Revised: 10/21/2012 Document Reviewed: 08/26/2012 [facility] Patient Information 2014 [place], [place].

## 2013-06-09 NOTE — ED Notes (Signed)
Pt comfortable with discharge and follow up instructions. Prescriptions x3. 

## 2013-06-09 NOTE — ED Provider Notes (Signed)
CSN: [id number]     Arrival date & time 06/09/13  1657 History  This chart was scribed for non-physician practitioner, Arthor Captain, PA-C, working with Toy Baker, MD by Charline Bills, ED Scribe. This patient was seen in room TR10C/TR10C and the patient's care was started at 6:39 PM.     Chief Complaint  Patient presents with  . Cough  . Sore Throat    The history is provided by the patient. No language interpreter was used.   HPI Comments: Kaylee Walter is a 28 y.o. female, with a history of asthma, who presents to the Emergency Department complaining of cough. Pt reports associated SOB and chest tightness onset last night. Pt also reports associated chills, dizziness and weakness onset last night. She also reports sore throat onset earlier today. Pt last took Albuterol 3 hours ago. No sick contacts.   Past Medical History  Diagnosis Date  . Thyroid disease   . Ectopic pregnancy   . Asthma    Past Surgical History  Procedure Laterality Date  . Other surgical history      c-section  . Ectopic pregnancy surgery    . Cesarean section     No family history on file. History  Substance Use Topics  . Smoking status: Never Smoker   . Smokeless tobacco: Not on file  . Alcohol Use: Yes     Comment: occ   OB History   Grav Para Term Preterm Abortions TAB SAB Ect Mult Living                 Review of Systems  Constitutional: Positive for chills.  HENT: Positive for sore throat.   Respiratory: Positive for cough, chest tightness and shortness of breath.   Neurological: Positive for dizziness and weakness.  All other systems reviewed and are negative.   Allergies  Review of patient's allergies indicates no known allergies.  Home Medications   Current Outpatient Rx  Name  Route  Sig  Dispense  Refill  . albuterol (PROVENTIL HFA;VENTOLIN HFA) 108 (90 BASE) MCG/ACT inhaler   Inhalation   Inhale 2 puffs into the lungs every 6 (six) hours as needed for  wheezing.         Marland Kitchen ibuprofen (ADVIL,MOTRIN) 800 MG tablet   Oral   Take 1 tablet (800 mg total) by mouth 3 (three) times daily.   21 tablet   0   . norgestimate-ethinyl estradiol (ORTHO-CYCLEN,SPRINTEC,PREVIFEM) 0.25-35 MG-MCG tablet   Oral   Take 1 tablet by mouth daily.          Triage Vitals: BP 133/77  Pulse 99  Temp(Src) 98.2 F (36.8 C) (Oral)  Resp 22  Wt 157 lb (71.215 kg)  SpO2 94%  LMP 05/02/2013 Physical Exam  Nursing note and vitals reviewed. Constitutional: She is oriented to person, place, and time. She appears well-developed and well-nourished.  HENT:  Head: Normocephalic and atraumatic.  Right Ear: External ear normal.  Left Ear: External ear normal.  Mouth/Throat: Oropharynx is clear and moist.  Eyes: Conjunctivae and EOM are normal.  Neck: Normal range of motion. Neck supple.  Cardiovascular: Normal rate, normal heart sounds and intact distal pulses.   Pulmonary/Chest: Effort normal. She has wheezes.  Abdominal: Soft. Bowel sounds are normal. There is no tenderness. There is no rebound.  Musculoskeletal: Normal range of motion.  Neurological: She is alert and oriented to person, place, and time.  Skin: Skin is warm.    ED Course  Procedures (including  critical care time) DIAGNOSTIC STUDIES: Oxygen Saturation is 94% on RA, adequate by my interpretation.    COORDINATION OF CARE: 6:44 PM-Discussed treatment plan which includes CXR with pt at bedside and pt agreed to plan.   Labs Review Labs Reviewed - No data to display Imaging Review No results found.   EKG Interpretation None      MDM   Final diagnoses:  Chest cold  RAD (reactive airway disease)   Pt CXR negative for acute infiltrate. Patients symptoms are consistent with URI, likely viral etiology. Discussed that antibiotics are not indicated for viral infections. Pt will be discharged with symptomatic treatment.  Verbalizes understanding and is agreeable with plan. Pt is  hemodynamically stable & in NAD prior to dc.   I personally performed the services described in this documentation, which was scribed in my presence. The recorded information has been reviewed and is accurate.     Arthor CaptainAbigail Chaela Branscum, PA-C 06/15/13 2253

## 2013-06-18 NOTE — ED Provider Notes (Signed)
Medical screening examination/treatment/procedure(s) were performed by non-physician practitioner and as supervising physician I was immediately available for consultation/collaboration.   EKG Interpretation None       Darci Lykins T Delonta Yohannes, MD 06/18/13 1821 

## 2014-11-20 ENCOUNTER — Emergency Department (HOSPITAL_COMMUNITY): Payer: 59

## 2014-11-20 ENCOUNTER — Emergency Department (HOSPITAL_COMMUNITY)
Admission: EM | Admit: 2014-11-20 | Discharge: 2014-11-20 | Disposition: A | Discharge status: Home / Self Care | Payer: 59 | Attending: Emergency Medicine | Admitting: Emergency Medicine

## 2014-11-20 ENCOUNTER — Encounter (HOSPITAL_COMMUNITY): Payer: Self-pay | Admitting: *Deleted

## 2014-11-20 DIAGNOSIS — Z79899 Other long term (current) drug therapy: Secondary | ICD-10-CM | POA: Insufficient documentation

## 2014-11-20 DIAGNOSIS — J988 Other specified respiratory disorders: Secondary | ICD-10-CM

## 2014-11-20 DIAGNOSIS — Z7952 Long term (current) use of systemic steroids: Secondary | ICD-10-CM | POA: Diagnosis not present

## 2014-11-20 DIAGNOSIS — J45901 Unspecified asthma with (acute) exacerbation: Secondary | ICD-10-CM | POA: Diagnosis not present

## 2014-11-20 DIAGNOSIS — Z8639 Personal history of other endocrine, nutritional and metabolic disease: Secondary | ICD-10-CM | POA: Diagnosis not present

## 2014-11-20 DIAGNOSIS — Z793 Long term (current) use of hormonal contraceptives: Secondary | ICD-10-CM | POA: Insufficient documentation

## 2014-11-20 DIAGNOSIS — R0981 Nasal congestion: Secondary | ICD-10-CM | POA: Diagnosis present

## 2014-11-20 MED ORDER — IPRATROPIUM-ALBUTEROL 0.5-2.5 (3) MG/3ML IN SOLN
3.0000 mL | Freq: Once | RESPIRATORY_TRACT | Status: AC
  Administered 2014-11-20: 3 mL via RESPIRATORY_TRACT
  Filled 2014-11-20: qty 3

## 2014-11-20 MED ORDER — SALINE SPRAY 0.65 % NA SOLN
1.0000 | Freq: Once | NASAL | Status: AC
  Administered 2014-11-20: 1 via NASAL
  Filled 2014-11-20: qty 44

## 2014-11-20 MED ORDER — BENZONATATE 100 MG PO CAPS: 100.0000 mg | ORAL_CAPSULE | Freq: Three times a day (TID) | ORAL | Status: DC

## 2014-11-20 MED ORDER — PREDNISONE 20 MG PO TABS
40.0000 mg | ORAL_TABLET | Freq: Once | ORAL | Status: AC
  Administered 2014-11-20: 40 mg via ORAL
  Filled 2014-11-20: qty 2

## 2014-11-20 MED ORDER — PREDNISONE 20 MG PO TABS: 40.0000 mg | ORAL_TABLET | Freq: Every day | ORAL | Status: DC

## 2014-11-20 NOTE — ED Notes (Signed)
pt states cough brings up yellow/green sputum.

## 2014-11-20 NOTE — ED Notes (Signed)
Pt c/o cough x 2 weeks, chest and nasal congestion. Pt states she has taken mucinex and inhaler with relief.

## 2014-11-20 NOTE — Discharge Instructions (Signed)
Please follow up with your primary care physician in 1-2 days. If you do not have one please call the [facility] and wellness [facility] number listed above. Please read all discharge instructions and return precautions.    Viral Infections A viral infection can be caused by different types of viruses.Most viral infections are not serious and resolve on their own. However, some infections may cause severe symptoms and may lead to further complications. SYMPTOMS Viruses can frequently cause:  Minor sore throat.  Aches and pains.  Headaches.  Runny nose.  Different types of rashes.  Watery eyes.  Tiredness.  Cough.  Loss of appetite.  Gastrointestinal infections, resulting in nausea, vomiting, and diarrhea. These symptoms do not respond to antibiotics because the infection is not caused by bacteria. However, you might catch a bacterial infection following the viral infection. This is sometimes called a "superinfection." Symptoms of such a bacterial infection may include:  Worsening sore throat with pus and difficulty swallowing.  Swollen neck glands.  Chills and a high or persistent fever.  Severe headache.  Tenderness over the sinuses.  Persistent overall ill feeling (malaise), muscle aches, and tiredness (fatigue).  Persistent cough.  Yellow, green, or brown mucus production with coughing. HOME CARE INSTRUCTIONS   Only take over-the-counter or prescription medicines for pain, discomfort, diarrhea, or fever as directed by your caregiver.  Drink enough water and fluids to keep your urine clear or pale yellow. Sports drinks can provide valuable electrolytes, sugars, and hydration.  Get plenty of rest and maintain proper nutrition. Soups and broths with crackers or rice are fine. SEEK IMMEDIATE MEDICAL CARE IF:   You have severe headaches, shortness of breath, chest pain, neck pain, or an unusual rash.  You have uncontrolled vomiting, diarrhea, or you are unable to  keep down fluids.  You or your child has an oral temperature above 102 F (38.9 C), not controlled by medicine.  Your baby is older than 3 months with a rectal temperature of 102 F (38.9 C) or higher.  Your baby is 24 months old or younger with a rectal temperature of 100.4 F (38 C) or higher. MAKE SURE YOU:   Understand these instructions.  Will watch your condition.  Will get help right away if you are not doing well or get worse. Document Released: 11/28/2004 Document Revised: 05/13/2011 Document Reviewed: 06/25/2010 [facility] Patient Information 2015 [place], [place]. This information is not intended to replace advice given to you by your health care provider. Make sure you discuss any questions you have with your health care provider. Asthma Asthma is a recurring condition in which the airways tighten and narrow. Asthma can make it difficult to breathe. It can cause coughing, wheezing, and shortness of breath. Asthma episodes, also called asthma attacks, range from minor to life-threatening. Asthma cannot be cured, but medicines and lifestyle changes can help control it. CAUSES Asthma is believed to be caused by inherited (genetic) and environmental factors, but its exact cause is unknown. Asthma may be triggered by allergens, lung infections, or irritants in the air. Asthma triggers are different for each person. Common triggers include:   Animal dander.  Dust mites.  Cockroaches.  Pollen from trees or grass.  Mold.  Smoke.  Air pollutants such as dust, household cleaners, hair sprays, aerosol sprays, paint fumes, strong chemicals, or strong odors.  Cold air, weather changes, and winds (which increase molds and pollens in the air).  Strong emotional expressions such as crying or laughing hard.  Stress.  Certain medicines (such as aspirin) or types of drugs (such as beta-blockers).  Sulfites in foods and drinks. Foods and drinks that may contain sulfites include  dried fruit, potato chips, and sparkling grape juice.  Infections or inflammatory conditions such as the flu, a cold, or an inflammation of the nasal membranes (rhinitis).  Gastroesophageal reflux disease (GERD).  Exercise or strenuous activity. SYMPTOMS Symptoms may occur immediately after asthma is triggered or many hours later. Symptoms include:  Wheezing.  Excessive nighttime or early morning coughing.  Frequent or severe coughing with a common cold.  Chest tightness.  Shortness of breath. DIAGNOSIS  The diagnosis of asthma is made by a review of your medical history and a physical exam. Tests may also be performed. These may include:  Lung function studies. These tests show how much air you breathe in and out.  Allergy tests.  Imaging tests such as X-rays. TREATMENT  Asthma cannot be cured, but it can usually be controlled. Treatment involves identifying and avoiding your asthma triggers. It also involves medicines. There are 2 classes of medicine used for asthma treatment:   Controller medicines. These prevent asthma symptoms from occurring. They are usually taken every day.  Reliever or rescue medicines. These quickly relieve asthma symptoms. They are used as needed and provide short-term relief. Your health care provider will help you create an asthma action plan. An asthma action plan is a written plan for managing and treating your asthma attacks. It includes a list of your asthma triggers and how they may be avoided. It also includes information on when medicines should be taken and when their dosage should be changed. An action plan may also involve the use of a device called a peak flow meter. A peak flow meter measures how well the lungs are working. It helps you monitor your condition. HOME CARE INSTRUCTIONS   Take medicines only as directed by your health care provider. Speak with your health care provider if you have questions about how or when to take the  medicines.  Use a peak flow meter as directed by your health care provider. Record and keep track of readings.  Understand and use the action plan to help minimize or stop an asthma attack without needing to seek medical care.  Control your home environment in the following ways to help prevent asthma attacks:  Do not smoke. Avoid being exposed to secondhand smoke.  Change your heating and air conditioning filter regularly.  Limit your use of fireplaces and wood stoves.  Get rid of pests (such as roaches and mice) and their droppings.  Throw away plants if you see mold on them.  Clean your floors and dust regularly. Use unscented cleaning products.  Try to have someone else vacuum for you regularly. Stay out of rooms while they are being vacuumed and for a short while afterward. If you vacuum, use a dust mask from a hardware store, a double-layered or microfilter vacuum cleaner bag, or a vacuum cleaner with a HEPA filter.  Replace carpet with wood, tile, or vinyl flooring. Carpet can trap dander and dust.  Use allergy-proof pillows, mattress covers, and box spring covers.  Wash bed sheets and blankets every week in hot water and dry them in a dryer.  Use blankets that are made of polyester or cotton.  Clean bathrooms and kitchens with bleach. If possible, have someone repaint the walls in these rooms with mold-resistant paint. Keep out of the rooms that are being cleaned and painted.  Wash hands frequently. SEEK MEDICAL CARE IF:   You have wheezing, shortness of breath, or a cough even if taking medicine to prevent attacks.  The colored mucus you cough up (sputum) is thicker than usual.  Your sputum changes from clear or white to yellow, green, gray, or bloody.  You have any problems that may be related to the medicines you are taking (such as a rash, itching, swelling, or trouble breathing).  You are using a reliever medicine more than 2-3 times per week.  Your peak flow  is still at 50-79% of your personal best after following your action plan for 1 hour.  You have a fever. SEEK IMMEDIATE MEDICAL CARE IF:   You seem to be getting worse and are unresponsive to treatment during an asthma attack.  You are short of breath even at rest.  You get short of breath when doing very little physical activity.  You have difficulty eating, drinking, or talking due to asthma symptoms.  You develop chest pain.  You develop a fast heartbeat.  You have a bluish color to your lips or fingernails.  You are light-headed, dizzy, or faint.  Your peak flow is less than 50% of your personal best. MAKE SURE YOU:   Understand these instructions.  Will watch your condition.  Will get help right away if you are not doing well or get worse. Document Released: 02/18/2005 Document Revised: 07/05/2013 Document Reviewed: 09/17/2012 [facility] Patient Information 2015 [place], [place]. This information is not intended to replace advice given to you by your health care provider. Make sure you discuss any questions you have with your health care provider.

## 2014-11-20 NOTE — ED Provider Notes (Signed)
CSN: [id number]     Arrival date & time 11/20/14  2018 History   First MD Initiated Contact with Patient 11/20/14 2035     Chief Complaint  Patient presents with  . Nasal Congestion  . Shortness of Breath  . Cough     (Consider location/radiation/quality/duration/timing/severity/associated sxs/prior Treatment) HPI Comments: Patient is a 29 year old past medical history significant for asthma female presenting to the urgency department for evaluation of 2 weeks of productive cough with green mucus, nasal congestion, chest congestion. Patient states she started feeling short of breath over the last 1-2 days. She states she has been trying her inhaler as prescribed with little to no improvement of her symptoms. She states she last used her inhaler just prior to arrival with some relief of wheezing. Denies any history of hospitalizations for asthma exacerbation. No sick contacts noted.   Past Medical History  Diagnosis Date  . Thyroid disease   . Ectopic pregnancy   . Asthma    Past Surgical History  Procedure Laterality Date  . Other surgical history      c-section  . Ectopic pregnancy surgery    . Cesarean section     No family history on file. Social History  Substance Use Topics  . Smoking status: Never Smoker   . Smokeless tobacco: None  . Alcohol Use: Yes     Comment: occ   OB History    No data available     Review of Systems  HENT: Positive for congestion and rhinorrhea.   Respiratory: Positive for cough and shortness of breath.   All other systems reviewed and are negative.     Allergies  Review of patient's allergies indicates no known allergies.  Home Medications   Prior to Admission medications   Medication Sig Start Date End Date Taking? Authorizing Provider  albuterol (PROVENTIL HFA;VENTOLIN HFA) 108 (90 BASE) MCG/ACT inhaler Inhale 2 puffs into the lungs every 6 (six) hours as needed for wheezing.    Historical Provider, MD  albuterol (PROVENTIL  HFA;VENTOLIN HFA) 108 (90 BASE) MCG/ACT inhaler Inhale 1-2 puffs into the lungs every 4 (four) hours as needed for wheezing or shortness of breath. 06/09/13   Arthor Captain, PA-C  benzonatate (TESSALON) 100 MG capsule Take 1 capsule (100 mg total) by mouth every 8 (eight) hours. 11/20/14   Keeton Kassebaum, PA-C  budesonide (PULMICORT FLEXHALER) 180 MCG/ACT inhaler Inhale 1 puff into the lungs 2 (two) times daily. 06/09/13   Arthor Captain, PA-C  ibuprofen (ADVIL,MOTRIN) 800 MG tablet Take 1 tablet (800 mg total) by mouth 3 (three) times daily. 10/06/12   Felicie Morn, NP  norgestimate-ethinyl estradiol (ORTHO-CYCLEN,SPRINTEC,PREVIFEM) 0.25-35 MG-MCG tablet Take 1 tablet by mouth daily.    Historical Provider, MD  predniSONE (DELTASONE) 20 MG tablet Take 2 tablets (40 mg total) by mouth daily. 06/09/13   Arthor Captain, PA-C  predniSONE (DELTASONE) 20 MG tablet Take 2 tablets (40 mg total) by mouth daily. 11/20/14   Rhenda Oregon, PA-C   BP 114/72 mmHg  Pulse 70  Temp(Src) 97.5 F (36.4 C) (Oral)  Resp 18  Ht  (1.626 m)  Wt 159 lb (72.122 kg)  BMI 27.28 kg/m2  SpO2 98%  LMP 11/20/2014 Physical Exam  Constitutional: She is oriented to person, place, and time. She appears well-developed and well-nourished. No distress.  HENT:  Head: Normocephalic and atraumatic.  Right Ear: External ear normal.  Left Ear: External ear normal.  Mouth/Throat: Oropharynx is clear and moist.  Nasal congestion, rhinorrhea.  Eyes: Conjunctivae are normal.  Neck: Neck supple.  Cardiovascular: Normal rate, regular rhythm and normal heart sounds.   Pulmonary/Chest: Effort normal. No accessory muscle usage. She has wheezes (Slight). She exhibits no tenderness.  Abdominal: Soft. There is no tenderness.  Musculoskeletal: Normal range of motion. She exhibits no edema.  Neurological: She is alert and oriented to person, place, and time.  Skin: Skin is warm and dry. She is not diaphoretic.  Nursing note and  vitals reviewed.   ED Course  Procedures (including critical care time) Medications  sodium chloride (OCEAN) 0.65 % nasal spray 1 spray (1 spray Each Nare Given 11/20/14 2139)  ipratropium-albuterol (DUONEB) 0.5-2.5 (3) MG/3ML nebulizer solution 3 mL (3 mLs Nebulization Given 11/20/14 2118)  predniSONE (DELTASONE) tablet 40 mg (40 mg Oral Given 11/20/14 2257)    Labs Review Labs Reviewed - No data to display  Imaging Review Dg Chest 2 View  11/20/2014   CLINICAL DATA:  Cough for 2 weeks, chest and nasal congestion, shortness of breath, asthma  EXAM: CHEST  2 VIEW  COMPARISON:  06/09/2013  FINDINGS: Normal heart size, mediastinal contours, and pulmonary vascularity.  Minimal peribronchial thickening.  Lungs otherwise clear.  No pleural effusion or pneumothorax.  No acute bony abnormalities.  IMPRESSION: Minimal peribronchial thickening which could reflect asthma or bronchitis.  No acute infiltrate.   Electronically Signed   By: Ulyses Southward M.D.   On: 11/20/2014 22:35   I have personally reviewed and evaluated these images and lab results as part of my medical decision-making.   EKG Interpretation None      MDM   Final diagnoses:  Wheezing-associated respiratory infection    Afebrile, NAD, non-toxic appearing, AAOx4.   Pt CXR negative for acute infiltrate. Patients symptoms are consistent with URI, likely viral etiology. Discussed that antibiotics are not indicated for viral infections. Breathing improved after duoneb administration. Lungs clear to auscultation on re-evaluation. Pt will be discharged with symptomatic treatment.  Verbalizes understanding and is agreeable with plan. Pt is hemodynamically stable & in NAD prior to dc.    Francee Piccolo, PA-C 11/21/14 0000  Richardean Canal, MD 11/21/14 [phone]

## 2014-11-20 NOTE — ED Notes (Signed)
Patient transported to CT 

## 2014-11-20 NOTE — ED Notes (Signed)
Pt returned to room E43 from radiology

## 2015-08-07 DIAGNOSIS — A599 Trichomoniasis, unspecified: Secondary | ICD-10-CM | POA: Diagnosis not present

## 2015-08-07 DIAGNOSIS — N760 Acute vaginitis: Secondary | ICD-10-CM | POA: Diagnosis not present

## 2015-08-08 DIAGNOSIS — A599 Trichomoniasis, unspecified: Secondary | ICD-10-CM | POA: Diagnosis not present

## 2015-11-22 ENCOUNTER — Encounter (HOSPITAL_COMMUNITY): Payer: Self-pay | Admitting: Emergency Medicine

## 2015-11-22 ENCOUNTER — Emergency Department (HOSPITAL_COMMUNITY): Payer: BLUE CROSS/BLUE SHIELD

## 2015-11-22 ENCOUNTER — Inpatient Hospital Stay (HOSPITAL_COMMUNITY)
Admission: EM | Admit: 2015-11-22 | Discharge: 2015-11-24 | DRG: 202 | Disposition: A | Discharge status: Home / Self Care | Payer: BLUE CROSS/BLUE SHIELD | Attending: Internal Medicine | Admitting: Internal Medicine

## 2015-11-22 DIAGNOSIS — J9601 Acute respiratory failure with hypoxia: Secondary | ICD-10-CM | POA: Diagnosis present

## 2015-11-22 DIAGNOSIS — J45901 Unspecified asthma with (acute) exacerbation: Secondary | ICD-10-CM | POA: Diagnosis present

## 2015-11-22 DIAGNOSIS — Z8249 Family history of ischemic heart disease and other diseases of the circulatory system: Secondary | ICD-10-CM

## 2015-11-22 DIAGNOSIS — E876 Hypokalemia: Secondary | ICD-10-CM | POA: Diagnosis present

## 2015-11-22 DIAGNOSIS — R0602 Shortness of breath: Secondary | ICD-10-CM | POA: Diagnosis not present

## 2015-11-22 DIAGNOSIS — R000 Tachycardia, unspecified: Secondary | ICD-10-CM | POA: Diagnosis present

## 2015-11-22 DIAGNOSIS — E079 Disorder of thyroid, unspecified: Secondary | ICD-10-CM | POA: Diagnosis present

## 2015-11-22 DIAGNOSIS — J705 Respiratory conditions due to smoke inhalation: Secondary | ICD-10-CM | POA: Diagnosis present

## 2015-11-22 DIAGNOSIS — X088XXA Exposure to other specified smoke, fire and flames, initial encounter: Secondary | ICD-10-CM | POA: Diagnosis present

## 2015-11-22 DIAGNOSIS — T59811A Toxic effect of smoke, accidental (unintentional), initial encounter: Secondary | ICD-10-CM | POA: Diagnosis present

## 2015-11-22 MED ORDER — ALBUTEROL SULFATE (2.5 MG/3ML) 0.083% IN NEBU
5.0000 mg | INHALATION_SOLUTION | Freq: Once | RESPIRATORY_TRACT | Status: AC
  Administered 2015-11-22: 5 mg via RESPIRATORY_TRACT
  Filled 2015-11-22: qty 6

## 2015-11-22 MED ORDER — MAGNESIUM SULFATE 2 GM/50ML IV SOLN
2.0000 g | Freq: Once | INTRAVENOUS | Status: AC
  Administered 2015-11-23: 2 g via INTRAVENOUS
  Filled 2015-11-22: qty 50

## 2015-11-22 MED ORDER — ALBUTEROL (5 MG/ML) CONTINUOUS INHALATION SOLN
10.0000 mg/h | INHALATION_SOLUTION | RESPIRATORY_TRACT | Status: DC
  Administered 2015-11-22: 10 mg/h via RESPIRATORY_TRACT
  Filled 2015-11-22 (×2): qty 20

## 2015-11-22 MED ORDER — LORATADINE 10 MG PO TABS
10.0000 mg | ORAL_TABLET | Freq: Once | ORAL | Status: AC
  Administered 2015-11-22: 10 mg via ORAL
  Filled 2015-11-22: qty 1

## 2015-11-22 MED ORDER — METHYLPREDNISOLONE SODIUM SUCC 125 MG IJ SOLR
125.0000 mg | Freq: Once | INTRAMUSCULAR | Status: AC
  Administered 2015-11-23: 125 mg via INTRAVENOUS
  Filled 2015-11-22: qty 2

## 2015-11-22 MED ORDER — ALBUTEROL (5 MG/ML) CONTINUOUS INHALATION SOLN
10.0000 mg/h | INHALATION_SOLUTION | RESPIRATORY_TRACT | Status: DC
  Administered 2015-11-23: 10 mg/h via RESPIRATORY_TRACT

## 2015-11-22 NOTE — ED Notes (Signed)
Respiratory called for breathing treatment.
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
## 2015-11-22 NOTE — ED Notes (Signed)
Bed: WA23 Expected date:  Expected time:  Means of arrival:  Comments: Triage 2 
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
## 2015-11-23 ENCOUNTER — Encounter (HOSPITAL_COMMUNITY): Payer: Self-pay | Admitting: Emergency Medicine

## 2015-11-23 DIAGNOSIS — J705 Respiratory conditions due to smoke inhalation: Secondary | ICD-10-CM | POA: Diagnosis present

## 2015-11-23 DIAGNOSIS — J45901 Unspecified asthma with (acute) exacerbation: Secondary | ICD-10-CM | POA: Diagnosis not present

## 2015-11-23 DIAGNOSIS — E876 Hypokalemia: Secondary | ICD-10-CM | POA: Diagnosis not present

## 2015-11-23 DIAGNOSIS — X088XXA Exposure to other specified smoke, fire and flames, initial encounter: Secondary | ICD-10-CM | POA: Diagnosis present

## 2015-11-23 DIAGNOSIS — E079 Disorder of thyroid, unspecified: Secondary | ICD-10-CM | POA: Diagnosis present

## 2015-11-23 DIAGNOSIS — J9601 Acute respiratory failure with hypoxia: Secondary | ICD-10-CM | POA: Diagnosis not present

## 2015-11-23 DIAGNOSIS — Z8249 Family history of ischemic heart disease and other diseases of the circulatory system: Secondary | ICD-10-CM | POA: Diagnosis not present

## 2015-11-23 DIAGNOSIS — T59811A Toxic effect of smoke, accidental (unintentional), initial encounter: Secondary | ICD-10-CM | POA: Diagnosis present

## 2015-11-23 DIAGNOSIS — R000 Tachycardia, unspecified: Secondary | ICD-10-CM | POA: Diagnosis not present

## 2015-11-23 LAB — CBC WITH DIFFERENTIAL/PLATELET
BASOS ABS: 0 10*3/uL (ref 0.0–0.1)
Basophils Relative: 0 %
EOS PCT: 3 %
Eosinophils Absolute: 0.2 10*3/uL (ref 0.0–0.7)
HEMATOCRIT: 38 % (ref 36.0–46.0)
Hemoglobin: 12.7 g/dL (ref 12.0–15.0)
LYMPHS ABS: 2.1 10*3/uL (ref 0.7–4.0)
LYMPHS PCT: 25 %
MCH: 30.2 pg (ref 26.0–34.0)
MCHC: 33.4 g/dL (ref 30.0–36.0)
MCV: 90.5 fL (ref 78.0–100.0)
Monocytes Absolute: 0.5 10*3/uL (ref 0.1–1.0)
Monocytes Relative: 6 %
NEUTROS ABS: 5.5 10*3/uL (ref 1.7–7.7)
Neutrophils Relative %: 66 %
Platelets: 243 10*3/uL (ref 150–400)
RBC: 4.2 MIL/uL (ref 3.87–5.11)
RDW: 13.5 % (ref 11.5–15.5)
WBC: 8.4 10*3/uL (ref 4.0–10.5)

## 2015-11-23 LAB — BLOOD GAS, ARTERIAL
ACID-BASE DEFICIT: 4.2 mmol/L — AB (ref 0.0–2.0)
Bicarbonate: 19.4 mmol/L — ABNORMAL LOW (ref 20.0–28.0)
DRAWN BY: 11249
O2 CONTENT: 8 L/min
O2 SAT: 99 %
PATIENT TEMPERATURE: 98.7
PCO2 ART: 32.8 mmHg (ref 32.0–48.0)
pH, Arterial: 7.391 (ref 7.350–7.450)
pO2, Arterial: 171 mmHg — ABNORMAL HIGH (ref 83.0–108.0)

## 2015-11-23 LAB — I-STAT CHEM 8, ED
BUN: 9 mg/dL (ref 6–20)
CALCIUM ION: 1.15 mmol/L (ref 1.15–1.40)
CHLORIDE: 106 mmol/L (ref 101–111)
CREATININE: 0.7 mg/dL (ref 0.44–1.00)
GLUCOSE: 118 mg/dL — AB (ref 65–99)
HCT: 39 % (ref 36.0–46.0)
Hemoglobin: 13.3 g/dL (ref 12.0–15.0)
POTASSIUM: 3.4 mmol/L — AB (ref 3.5–5.1)
Sodium: 141 mmol/L (ref 135–145)
TCO2: 23 mmol/L (ref 0–100)

## 2015-11-23 LAB — I-STAT BETA HCG BLOOD, ED (MC, WL, AP ONLY): I-stat hCG, quantitative: <5 m[IU]/mL (ref <5)

## 2015-11-23 LAB — HIV ANTIBODY (ROUTINE TESTING W REFLEX): HIV Screen 4th Generation wRfx: NONREACTIVE

## 2015-11-23 MED ORDER — GUAIFENESIN ER 600 MG PO TB12
600.0000 mg | ORAL_TABLET | Freq: Two times a day (BID) | ORAL | Status: DC
  Administered 2015-11-23 – 2015-11-24 (×4): 600 mg via ORAL
  Filled 2015-11-23 (×4): qty 1

## 2015-11-23 MED ORDER — ONDANSETRON HCL 4 MG/2ML IJ SOLN: 4.0000 mg | Freq: Three times a day (TID) | INTRAMUSCULAR | Status: DC | PRN

## 2015-11-23 MED ORDER — LEVALBUTEROL HCL 1.25 MG/0.5ML IN NEBU: 1.2500 mg | INHALATION_SOLUTION | Freq: Four times a day (QID) | RESPIRATORY_TRACT | Status: DC

## 2015-11-23 MED ORDER — SODIUM CHLORIDE 0.9 % IV BOLUS (SEPSIS)
2000.0000 mL | Freq: Once | INTRAVENOUS | Status: AC
Start: 2015-11-23 — End: 2015-11-23
  Administered 2015-11-23: 2000 mL via INTRAVENOUS

## 2015-11-23 MED ORDER — ENOXAPARIN SODIUM 40 MG/0.4ML ~~LOC~~ SOLN
40.0000 mg | SUBCUTANEOUS | Status: DC
  Administered 2015-11-23: 40 mg via SUBCUTANEOUS
  Filled 2015-11-23: qty 0.4

## 2015-11-23 MED ORDER — BUDESONIDE 0.25 MG/2ML IN SUSP
0.2500 mg | Freq: Two times a day (BID) | RESPIRATORY_TRACT | Status: DC
  Administered 2015-11-23 – 2015-11-24 (×2): 0.25 mg via RESPIRATORY_TRACT
  Filled 2015-11-23 (×2): qty 2

## 2015-11-23 MED ORDER — LEVALBUTEROL HCL 1.25 MG/0.5ML IN NEBU
1.2500 mg | INHALATION_SOLUTION | Freq: Three times a day (TID) | RESPIRATORY_TRACT | Status: DC
  Administered 2015-11-23 – 2015-11-24 (×2): 1.25 mg via RESPIRATORY_TRACT
  Filled 2015-11-23 (×3): qty 0.5

## 2015-11-23 MED ORDER — LEVALBUTEROL HCL 1.25 MG/0.5ML IN NEBU
1.2500 mg | INHALATION_SOLUTION | Freq: Four times a day (QID) | RESPIRATORY_TRACT | Status: DC
  Administered 2015-11-23: 1.25 mg via RESPIRATORY_TRACT
  Filled 2015-11-23 (×3): qty 0.5

## 2015-11-23 MED ORDER — POTASSIUM CHLORIDE 20 MEQ/15ML (10%) PO SOLN
20.0000 meq | Freq: Once | ORAL | Status: AC
  Administered 2015-11-23: 20 meq via ORAL
  Filled 2015-11-23: qty 15

## 2015-11-23 MED ORDER — LEVALBUTEROL HCL 1.25 MG/0.5ML IN NEBU
1.2500 mg | INHALATION_SOLUTION | RESPIRATORY_TRACT | Status: DC | PRN
  Filled 2015-11-23: qty 0.5

## 2015-11-23 MED ORDER — METHYLPREDNISOLONE SODIUM SUCC 125 MG IJ SOLR
60.0000 mg | Freq: Four times a day (QID) | INTRAMUSCULAR | Status: DC
  Administered 2015-11-23 – 2015-11-24 (×5): 60 mg via INTRAVENOUS
  Filled 2015-11-23 (×6): qty 2

## 2015-11-23 MED ORDER — ORAL CARE MOUTH RINSE
15.0000 mL | Freq: Two times a day (BID) | OROMUCOSAL | Status: DC
  Administered 2015-11-23 (×3): 15 mL via OROMUCOSAL

## 2015-11-23 MED ORDER — IPRATROPIUM BROMIDE 0.02 % IN SOLN: 0.5000 mg | RESPIRATORY_TRACT | Status: DC

## 2015-11-23 MED ORDER — ACETAMINOPHEN 325 MG PO TABS: 650.0000 mg | ORAL_TABLET | Freq: Four times a day (QID) | ORAL | Status: DC | PRN

## 2015-11-23 MED ORDER — IPRATROPIUM BROMIDE 0.02 % IN SOLN
0.5000 mg | Freq: Four times a day (QID) | RESPIRATORY_TRACT | Status: DC
  Administered 2015-11-23 (×2): 0.5 mg via RESPIRATORY_TRACT
  Filled 2015-11-23 (×2): qty 2.5

## 2015-11-23 MED ORDER — SODIUM CHLORIDE 0.9 % IV SOLN
INTRAVENOUS | Status: DC
  Administered 2015-11-23 (×2): via INTRAVENOUS

## 2015-11-23 MED ORDER — PNEUMOCOCCAL VAC POLYVALENT 25 MCG/0.5ML IJ INJ
0.5000 mL | INJECTION | INTRAMUSCULAR | Status: AC
  Administered 2015-11-24: 0.5 mL via INTRAMUSCULAR
  Filled 2015-11-23: qty 0.5

## 2015-11-23 MED ORDER — INFLUENZA VAC SPLIT QUAD 0.5 ML IM SUSY
0.5000 mL | PREFILLED_SYRINGE | INTRAMUSCULAR | Status: AC
  Administered 2015-11-24: 0.5 mL via INTRAMUSCULAR
  Filled 2015-11-23: qty 0.5

## 2015-11-23 MED ORDER — IPRATROPIUM BROMIDE 0.02 % IN SOLN
0.5000 mg | Freq: Three times a day (TID) | RESPIRATORY_TRACT | Status: DC
  Administered 2015-11-23 – 2015-11-24 (×2): 0.5 mg via RESPIRATORY_TRACT
  Filled 2015-11-23 (×2): qty 2.5

## 2015-11-23 NOTE — Progress Notes (Signed)
Patient requested to have a shower. RN notified the PCP for an order for a shower.
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
## 2015-11-23 NOTE — ED Notes (Signed)
Called respiratory to bedside for arterial blood gas.
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
## 2015-11-24 MED ORDER — PREDNISONE 50 MG PO TABS: 50.0000 mg | ORAL_TABLET | Freq: Every day | ORAL | 0 refills | Status: AC

## 2015-11-24 MED ORDER — ALBUTEROL SULFATE HFA 108 (90 BASE) MCG/ACT IN AERS: 2.0000 | INHALATION_SPRAY | RESPIRATORY_TRACT | 0 refills | Status: AC | PRN

## 2015-11-24 NOTE — Discharge Instructions (Signed)
Asthma Attack Prevention °While you may not be able to control the fact that you have asthma, you can take actions to prevent asthma attacks. The best way to prevent asthma attacks is to maintain good control of your asthma. You can achieve this by: °· Taking your medicines as directed. °· Avoiding things that can irritate your airways or make your asthma symptoms worse (asthma triggers). °· Keeping track of how well your asthma is controlled and of any changes in your symptoms. °· Responding quickly to worsening asthma symptoms (asthma attack). °· Seeking emergency care when it is needed. °WHAT ARE SOME WAYS TO PREVENT AN ASTHMA ATTACK? °Have a Plan °Work with your health care provider to create a written plan for managing and treating your asthma attacks (asthma action plan). This plan includes: °· A list of your asthma triggers and how you can avoid them. °· Information on when medicines should be taken and when their dosages should be changed. °· The use of a device that measures how well your lungs are working (peak flow meter). °Monitor Your Asthma °Use your peak flow meter and record your results in a journal every day. A drop in your peak flow numbers on one or more days may indicate the start of an asthma attack. This can happen even before you start to feel symptoms. You can prevent an asthma attack from getting worse by following the steps in your asthma action plan. °Avoid Asthma Triggers °Work with your asthma health care provider to find out what your asthma triggers are. This can be done by: °· Allergy testing. °· Keeping a journal that notes when asthma attacks occur and the factors that may have contributed to them. °· Determining if there are other medical conditions that are making your asthma worse. °Once you have determined your asthma triggers, take steps to avoid them. This may include avoiding excessive or prolonged exposure to: °· Dust. Have someone dust and vacuum your home for you once or  twice a week. Using a high-efficiency particulate arrestance (HEPA) vacuum is best. °· Smoke. This includes campfire smoke, forest fire smoke, and secondhand smoke from tobacco products. °· Pet dander. Avoid contact with animals that you know you are allergic to. °· Allergens from trees, grasses or pollens. Avoid spending a lot of time outdoors when pollen counts are high, and on very windy days. °· Very cold, dry, or humid air. °· Mold. °· Foods that contain high amounts of sulfites. °· Strong odors. °· Outdoor air pollutants, such as engine exhaust. °· Indoor air pollutants, such as aerosol sprays and fumes from household cleaners. °· Household pests, including dust mites and cockroaches, and pest droppings. °· Certain medicines, including NSAIDs. Always talk to your health care provider before stopping or starting any new medicines. °Medicines °Take over-the-counter and prescription medicines only as told by your health care provider. Many asthma attacks can be prevented by carefully following your medicine schedule. Taking your medicines correctly is especially important when you cannot avoid certain asthma triggers. °Act Quickly °If an asthma attack does happen, acting quickly can decrease how severe it is and how long it lasts. Take these steps:  °· Pay attention to your symptoms. If you are coughing, wheezing, or having difficulty breathing, do not wait to see if your symptoms go away on their own. Follow your asthma action plan. °· If you have followed your asthma action plan and your symptoms are not improving, call your health care provider or seek immediate medical care   at the nearest hospital. °It is important to note how often you need to use your fast-acting rescue inhaler. If you are using your rescue inhaler more often, it may mean that your asthma is not under control. Adjusting your asthma treatment plan may help you to prevent future asthma attacks and help you to gain better control of your  condition. °HOW CAN I PREVENT AN ASTHMA ATTACK WHEN I EXERCISE? °Follow advice from your health care provider about whether you should use your fast-acting inhaler before exercising. Many people with asthma experience exercise-induced bronchoconstriction (EIB). This condition often worsens during vigorous exercise in cold, humid, or dry environments. Usually, people with EIB can stay very active by pre-treating with a fast-acting inhaler before exercising. °  °This information is not intended to replace advice given to you by your health care provider. Make sure you discuss any questions you have with your health care provider. °  °Document Released: 02/06/2009 Document Revised: 11/09/2014 Document Reviewed: 07/21/2014 °Elsevier Interactive Patient Education ©2016 [employer] ° °Asthma, Adult °Asthma is a recurring condition in which the airways tighten and narrow. Asthma can make it difficult to breathe. It can cause coughing, wheezing, and shortness of breath. Asthma episodes, also called asthma attacks, range from minor to life-threatening. Asthma cannot be cured, but medicines and lifestyle changes can help control it. °CAUSES °Asthma is believed to be caused by inherited (genetic) and environmental factors, but its exact cause is unknown. Asthma may be triggered by allergens, lung infections, or irritants in the air. Asthma triggers are different for each person. Common triggers include:  °· Animal dander. °· Dust mites. °· Cockroaches. °· Pollen from trees or grass. °· Mold. °· Smoke. °· Air pollutants such as dust, household cleaners, hair sprays, aerosol sprays, paint fumes, strong chemicals, or strong odors. °· Cold air, weather changes, and winds (which increase molds and pollens in the air). °· Strong emotional expressions such as crying or laughing hard. °· Stress. °· Certain medicines (such as aspirin) or types of drugs (such as beta-blockers). °· Sulfites in foods and drinks. Foods and drinks that  may contain sulfites include dried fruit, potato chips, and sparkling grape juice. °· Infections or inflammatory conditions such as the flu, a cold, or an inflammation of the nasal membranes (rhinitis). °· Gastroesophageal reflux disease (GERD). °· Exercise or strenuous activity. °SYMPTOMS °Symptoms may occur immediately after asthma is triggered or many hours later. Symptoms include: °· Wheezing. °· Excessive nighttime or early morning coughing. °· Frequent or severe coughing with a common cold. °· Chest tightness. °· Shortness of breath. °DIAGNOSIS  °The diagnosis of asthma is made by a review of your medical history and a physical exam. Tests may also be performed. These may include: °· Lung function studies. These tests show how much air you breathe in and out. °· Allergy tests. °· Imaging tests such as X-rays. °TREATMENT  °Asthma cannot be cured, but it can usually be controlled. Treatment involves identifying and avoiding your asthma triggers. It also involves medicines. There are 2 classes of medicine used for asthma treatment:  °· Controller medicines. These prevent asthma symptoms from occurring. They are usually taken every day. °· Reliever or rescue medicines. These quickly relieve asthma symptoms. They are used as needed and provide Nestor Wieneke-term relief. °Your health care provider will help you create an asthma action plan. An asthma action plan is a written plan for managing and treating your asthma attacks. It includes a list of your asthma triggers and how they   may be avoided. It also includes information on when medicines should be taken and when their dosage should be changed. An action plan may also involve the use of a device called a peak flow meter. A peak flow meter measures how well the lungs are working. It helps you monitor your condition. °HOME CARE INSTRUCTIONS  °· Take medicines only as directed by your health care provider. Speak with your health care provider if you have questions about  how or when to take the medicines. °· Use a peak flow meter as directed by your health care provider. Record and keep track of readings. °· Understand and use the action plan to help minimize or stop an asthma attack without needing to seek medical care. °· Control your home environment in the following ways to help prevent asthma attacks: °¨ Do not smoke. Avoid being exposed to secondhand smoke. °¨ Change your heating and air conditioning filter regularly. °¨ Limit your use of fireplaces and wood stoves. °¨ Get rid of pests (such as roaches and mice) and their droppings. °¨ Throw away plants if you see mold on them. °¨ Clean your floors and dust regularly. Use unscented cleaning products. °¨ Try to have someone else vacuum for you regularly. Stay out of rooms while they are being vacuumed and for a Kyley Laurel while afterward. If you vacuum, use a dust mask from a hardware store, a double-layered or microfilter vacuum cleaner bag, or a vacuum cleaner with a HEPA filter. °¨ Replace carpet with wood, tile, or vinyl flooring. Carpet can trap dander and dust. °¨ Use allergy-proof pillows, mattress covers, and box spring covers. °¨ Wash bed sheets and blankets every week in hot water and dry them in a dryer. °¨ Use blankets that are made of polyester or cotton. °¨ Clean bathrooms and kitchens with bleach. If possible, have someone repaint the walls in these rooms with mold-resistant paint. Keep out of the rooms that are being cleaned and painted. °¨ Wash hands frequently. °SEEK MEDICAL CARE IF:  °· You have wheezing, shortness of breath, or a cough even if taking medicine to prevent attacks. °· The colored mucus you cough up (sputum) is thicker than usual. °· Your sputum changes from clear or white to yellow, green, gray, or bloody. °· You have any problems that may be related to the medicines you are taking (such as a rash, itching, swelling, or trouble breathing). °· You are using a reliever medicine more than 2-3 times per  week. °· Your peak flow is still at 50-79% of your personal best after following your action plan for 1 hour. °· You have a fever. °SEEK IMMEDIATE MEDICAL CARE IF:  °· You seem to be getting worse and are unresponsive to treatment during an asthma attack. °· You are Clementine Soulliere of breath even at rest. °· You get Neil Errickson of breath when doing very little physical activity. °· You have difficulty eating, drinking, or talking due to asthma symptoms. °· You develop chest pain. °· You develop a fast heartbeat. °· You have a bluish color to your lips or fingernails. °· You are light-headed, dizzy, or faint. °· Your peak flow is less than 50% of your personal best. °  °This information is not intended to replace advice given to you by your health care provider. Make sure you discuss any questions you have with your health care provider. °  °Document Released: 02/18/2005 Document Revised: 11/09/2014 Document Reviewed: 09/17/2012 °Elsevier Interactive Patient Education ©2016 [employer] ° °

## 2015-11-24 NOTE — Progress Notes (Signed)
Patient given discharge, follow up, and medication instructions, verbalized understanding, IV removed, patient to drive herself home
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
## 2015-12-12 DIAGNOSIS — L301 Dyshidrosis [pompholyx]: Secondary | ICD-10-CM | POA: Diagnosis not present

## 2015-12-12 DIAGNOSIS — J45998 Other asthma: Secondary | ICD-10-CM | POA: Diagnosis not present

## 2016-05-06 DIAGNOSIS — H109 Unspecified conjunctivitis: Secondary | ICD-10-CM | POA: Diagnosis not present

## 2016-05-06 DIAGNOSIS — J45998 Other asthma: Secondary | ICD-10-CM | POA: Diagnosis not present

## 2016-05-30 ENCOUNTER — Emergency Department (HOSPITAL_BASED_OUTPATIENT_CLINIC_OR_DEPARTMENT_OTHER)
Admission: EM | Admit: 2016-05-30 | Discharge: 2016-05-30 | Disposition: A | Discharge status: Home / Self Care | Payer: BLUE CROSS/BLUE SHIELD | Attending: Emergency Medicine | Admitting: Emergency Medicine

## 2016-05-30 ENCOUNTER — Encounter (HOSPITAL_BASED_OUTPATIENT_CLINIC_OR_DEPARTMENT_OTHER): Payer: Self-pay | Admitting: *Deleted

## 2016-05-30 DIAGNOSIS — J45909 Unspecified asthma, uncomplicated: Secondary | ICD-10-CM | POA: Diagnosis not present

## 2016-05-30 DIAGNOSIS — S4991XA Unspecified injury of right shoulder and upper arm, initial encounter: Secondary | ICD-10-CM | POA: Diagnosis not present

## 2016-05-30 DIAGNOSIS — S46911A Strain of unspecified muscle, fascia and tendon at shoulder and upper arm level, right arm, initial encounter: Secondary | ICD-10-CM

## 2016-05-30 DIAGNOSIS — Y929 Unspecified place or not applicable: Secondary | ICD-10-CM | POA: Diagnosis not present

## 2016-05-30 DIAGNOSIS — Y93F2 Activity, caregiving, lifting: Secondary | ICD-10-CM | POA: Insufficient documentation

## 2016-05-30 DIAGNOSIS — Y999 Unspecified external cause status: Secondary | ICD-10-CM | POA: Insufficient documentation

## 2016-05-30 DIAGNOSIS — X500XXA Overexertion from strenuous movement or load, initial encounter: Secondary | ICD-10-CM | POA: Diagnosis not present

## 2016-05-30 MED ORDER — TRAMADOL HCL 50 MG PO TABS
50.0000 mg | ORAL_TABLET | Freq: Once | ORAL | Status: AC
  Administered 2016-05-30: 50 mg via ORAL
  Filled 2016-05-30: qty 1

## 2016-05-30 MED ORDER — TRAMADOL HCL 50 MG PO TABS: 50.0000 mg | ORAL_TABLET | Freq: Two times a day (BID) | ORAL | 0 refills | Status: AC | PRN

## 2016-05-30 MED ORDER — KETOROLAC TROMETHAMINE 60 MG/2ML IM SOLN
60.0000 mg | Freq: Once | INTRAMUSCULAR | Status: AC
  Administered 2016-05-30: 60 mg via INTRAMUSCULAR
  Filled 2016-05-30: qty 2

## 2016-05-30 NOTE — ED Triage Notes (Signed)
c/o rt shoulder pain after lifting boxes pm  Took a flexerial and 2 ibu without relief

## 2016-05-30 NOTE — ED Provider Notes (Signed)
[facility] Provider [facility]   CSN: [id number] Arrival date & time: 05/30/16  0112     History   Chief Complaint Chief Complaint  Patient presents with  . Shoulder Pain    HPI Kaylee Walter is a 31 y.o. female no sig PMH, here with R shoulder pain.  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  This got worse last night when she helped her sister move heavy boxes and she experienced a sudden sharp pain. She took her sisters flexeril and ibuprofen with mild relief.  She still has worsening pain with movement.  Ddenies any occult trauma. There are no further complaints.  10 Systems reviewed and are negative for acute change except as noted in the HPI.   HPI  Past Medical History:  Diagnosis Date  . Asthma   . Ectopic pregnancy   . Thyroid disease     Patient Active Problem List   Diagnosis Date Noted  . Acute respiratory failure with hypoxia (HCC) 11/23/2015  . Asthma exacerbation 11/23/2015  . Hypokalemia 11/23/2015    Past Surgical History:  Procedure Laterality Date  . CESAREAN SECTION    . ECTOPIC PREGNANCY SURGERY    . OTHER SURGICAL HISTORY     c-section    OB History    No data available       Home Medications    Prior to Admission medications   Medication Sig Start Date End Date Taking? Authorizing Provider  albuterol (PROVENTIL HFA;VENTOLIN HFA) 108 (90 Base) MCG/ACT inhaler Inhale 2 puffs into the lungs every 4 (four) hours as needed for wheezing or shortness of breath. 11/24/15   Renae Fickle, MD  beclomethasone (QVAR) 80 MCG/ACT inhaler Inhale 1 puff into the lungs 2 (two) times daily.    Historical Provider, MD  predniSONE (DELTASONE) 50 MG tablet Take 1 tablet (50 mg total) by mouth daily with breakfast. 11/24/15   Renae Fickle, MD  [employer] 150-35 MCG/24HR transdermal patch Place 1 each onto the skin once a week. 11/12/15   Historical  Provider, MD    Family History Family History  Problem Relation Age of Onset  . Hypertension Mother     Social History Social History  Substance Use Topics  . Smoking status: Never Smoker  . Smokeless tobacco: Never Used  . Alcohol use Yes     Comment: socially      Allergies   Patient has no known allergies.   Review of Systems Review of Systems   Physical Exam Updated Vital Signs BP 114/81 (BP Location: Left Arm)   Temp 98.1 F (36.7 C) (Oral)   Resp 20   Ht 5\' 5"  (1.651 m)   Wt 160 lb (72.6 kg)   LMP 05/19/2016 (Exact Date)   SpO2 95%   BMI 26.63 kg/m   Physical Exam  Constitutional: She is oriented to person, place, and time. She appears well-developed and well-nourished. No distress.  HENT:  Head: Normocephalic and atraumatic.  Nose: Nose normal.  Mouth/Throat: Oropharynx is clear and moist. No oropharyngeal exudate.  Eyes: Conjunctivae and EOM are normal. Pupils are equal, round, and reactive to light. No scleral icterus.  Neck: Normal range of motion. Neck supple. No JVD present. No tracheal deviation present. No thyromegaly present.  Cardiovascular: Normal rate, regular rhythm and normal heart sounds.  Exam reveals no gallop and no friction rub.  No murmur heard. Pulmonary/Chest: Effort normal and breath sounds normal. No respiratory distress. She has no wheezes. She exhibits no tenderness.  Abdominal: Soft. Bowel sounds are normal. She exhibits no distension and no mass. There is no tenderness. There is no rebound and no guarding.  Musculoskeletal: Normal range of motion. She exhibits no edema, tenderness or deformity.  Normal ROM of the R shoulder, there is pain with movement. No swelling of deformity seen. Normal pulses distally  Lymphadenopathy:    She has no cervical adenopathy.  Neurological: She is alert and oriented to person, place, and time. No cranial nerve deficit. She exhibits normal muscle tone.  Skin: Skin is warm and dry. No rash  noted. No erythema. No pallor.  Nursing note and vitals reviewed.    ED Treatments / Results  Labs (all labs ordered are listed, but only abnormal results are displayed) Labs Reviewed - No data to display  EKG  EKG Interpretation None       Radiology No results found.  Procedures Procedures (including critical care time)  Medications Ordered in ED Medications  ketorolac (TORADOL) injection 60 mg (not administered)  traMADol (ULTRAM) tablet 50 mg (50 mg Oral Given 05/30/16 0314)     Initial Impression / Assessment and Plan / ED Course  I have reviewed the triage vital signs and the nursing notes.  Pertinent labs & imaging results that were available during my care of the patient were reviewed by me and considered in my medical decision making (see chart for details).     Patient presents to the ED for shoulder pain. Likely has a tear in the joint.  Advised on rest, no heavy lifting, ice packs, ibuprofen, and lidocaine patches.  Will DC with tramadol for severe pain. PCP fu advised. She appears well and in NAD. VS remain within her normal limits and she is safe for dC.  Final Clinical Impressions(s) / ED Diagnoses   Final diagnoses:  None    New Prescriptions New Prescriptions   No medications on file     Tomasita CrumbleAdeleke Lakecia Deschamps, MD 05/30/16 [phone]

## 2016-06-06 DIAGNOSIS — M25519 Pain in unspecified shoulder: Secondary | ICD-10-CM | POA: Diagnosis not present

## 2016-06-18 ENCOUNTER — Other Ambulatory Visit: Payer: Self-pay | Admitting: Obstetrics and Gynecology

## 2016-06-18 ENCOUNTER — Other Ambulatory Visit (HOSPITAL_COMMUNITY)
Admission: RE | Admit: 2016-06-18 | Discharge: 2016-06-18 | Disposition: A | Discharge status: Home / Self Care | Payer: BLUE CROSS/BLUE SHIELD | Source: Ambulatory Visit | Attending: Obstetrics and Gynecology | Admitting: Obstetrics and Gynecology

## 2016-06-18 DIAGNOSIS — Z131 Encounter for screening for diabetes mellitus: Secondary | ICD-10-CM | POA: Diagnosis not present

## 2016-06-18 DIAGNOSIS — Z1151 Encounter for screening for human papillomavirus (HPV): Secondary | ICD-10-CM | POA: Insufficient documentation

## 2016-06-18 DIAGNOSIS — Z01419 Encounter for gynecological examination (general) (routine) without abnormal findings: Secondary | ICD-10-CM | POA: Insufficient documentation

## 2016-06-18 DIAGNOSIS — Z01411 Encounter for gynecological examination (general) (routine) with abnormal findings: Secondary | ICD-10-CM | POA: Diagnosis not present

## 2016-06-18 DIAGNOSIS — Z13220 Encounter for screening for lipoid disorders: Secondary | ICD-10-CM | POA: Diagnosis not present

## 2016-06-18 DIAGNOSIS — Z309 Encounter for contraceptive management, unspecified: Secondary | ICD-10-CM | POA: Diagnosis not present

## 2016-06-18 DIAGNOSIS — N760 Acute vaginitis: Secondary | ICD-10-CM | POA: Diagnosis not present

## 2016-06-19 LAB — CYTOLOGY - PAP
Diagnosis: NEGATIVE
HPV (WINDOPATH): NOT DETECTED

## 2017-04-15 ENCOUNTER — Emergency Department (HOSPITAL_BASED_OUTPATIENT_CLINIC_OR_DEPARTMENT_OTHER): Payer: BLUE CROSS/BLUE SHIELD

## 2017-04-15 ENCOUNTER — Encounter (HOSPITAL_BASED_OUTPATIENT_CLINIC_OR_DEPARTMENT_OTHER): Payer: Self-pay | Admitting: Emergency Medicine

## 2017-04-15 ENCOUNTER — Other Ambulatory Visit: Payer: Self-pay

## 2017-04-15 ENCOUNTER — Emergency Department (HOSPITAL_BASED_OUTPATIENT_CLINIC_OR_DEPARTMENT_OTHER)
Admission: EM | Admit: 2017-04-15 | Discharge: 2017-04-16 | Disposition: A | Discharge status: Home / Self Care | Payer: BLUE CROSS/BLUE SHIELD | Attending: Emergency Medicine | Admitting: Emergency Medicine

## 2017-04-15 DIAGNOSIS — Z79899 Other long term (current) drug therapy: Secondary | ICD-10-CM | POA: Diagnosis not present

## 2017-04-15 DIAGNOSIS — S9031XA Contusion of right foot, initial encounter: Secondary | ICD-10-CM | POA: Diagnosis not present

## 2017-04-15 DIAGNOSIS — Y92009 Unspecified place in unspecified non-institutional (private) residence as the place of occurrence of the external cause: Secondary | ICD-10-CM | POA: Diagnosis not present

## 2017-04-15 DIAGNOSIS — Y999 Unspecified external cause status: Secondary | ICD-10-CM | POA: Diagnosis not present

## 2017-04-15 DIAGNOSIS — S9030XA Contusion of unspecified foot, initial encounter: Secondary | ICD-10-CM

## 2017-04-15 DIAGNOSIS — W2201XA Walked into wall, initial encounter: Secondary | ICD-10-CM | POA: Diagnosis not present

## 2017-04-15 DIAGNOSIS — M79671 Pain in right foot: Secondary | ICD-10-CM | POA: Diagnosis not present

## 2017-04-15 DIAGNOSIS — S99921A Unspecified injury of right foot, initial encounter: Secondary | ICD-10-CM | POA: Diagnosis not present

## 2017-04-15 DIAGNOSIS — J45909 Unspecified asthma, uncomplicated: Secondary | ICD-10-CM | POA: Insufficient documentation

## 2017-04-15 DIAGNOSIS — Y939 Activity, unspecified: Secondary | ICD-10-CM | POA: Diagnosis not present

## 2017-04-15 NOTE — ED Notes (Signed)
Patient transported to X-ray 

## 2017-04-15 NOTE — ED Triage Notes (Signed)
PT presents with c/o right foot pain after she fell tonight. PT took motrin at home and applied ace wrap without relief

## 2017-04-16 ENCOUNTER — Encounter (HOSPITAL_BASED_OUTPATIENT_CLINIC_OR_DEPARTMENT_OTHER): Payer: Self-pay | Admitting: Emergency Medicine

## 2017-04-16 MED ORDER — ACETAMINOPHEN 500 MG PO TABS
1000.0000 mg | ORAL_TABLET | Freq: Once | ORAL | Status: AC
  Administered 2017-04-16: 1000 mg via ORAL
  Filled 2017-04-16: qty 2

## 2017-04-16 NOTE — ED Notes (Signed)
ED Provider at bedside. 

## 2017-04-16 NOTE — ED Provider Notes (Signed)
[facility] EMERGENCY DEPARTMENT Provider Note   CSN: [id number] Arrival date & time: 04/15/17  2320     History   Chief Complaint Chief Complaint  Patient presents with  . Foot Injury    HPI Kaylee Walter is a 32 y.o. female.  The history is provided by the patient.  Foot Injury   The incident occurred 3 to 5 hours ago. The incident occurred at home. Injury mechanism: bumped foot on step. The pain is present in the right foot. The quality of the pain is described as aching. The pain is at a severity of 7/10. The pain is severe. The pain has been constant since onset. Pertinent negatives include no numbness, no inability to bear weight, no loss of motion, no muscle weakness, no loss of sensation and no tingling. She reports no foreign bodies present. The symptoms are aggravated by activity. She has tried nothing for the symptoms. The treatment provided no relief.    Past Medical History:  Diagnosis Date  . Asthma   . Ectopic pregnancy   . Thyroid disease     Patient Active Problem List   Diagnosis Date Noted  . Acute respiratory failure with hypoxia (HCC) 11/23/2015  . Asthma exacerbation 11/23/2015  . Hypokalemia 11/23/2015    Past Surgical History:  Procedure Laterality Date  . CESAREAN SECTION    . ECTOPIC PREGNANCY SURGERY    . OTHER SURGICAL HISTORY     c-section    OB History    No data available       Home Medications    Prior to Admission medications   Medication Sig Start Date End Date Taking? Authorizing Provider  albuterol (PROVENTIL HFA;VENTOLIN HFA) 108 (90 Base) MCG/ACT inhaler Inhale 2 puffs into the lungs every 4 (four) hours as needed for wheezing or shortness of breath. 11/24/15   Renae FickleShort, Mackenzie, MD  beclomethasone (QVAR) 80 MCG/ACT inhaler Inhale 1 puff into the lungs 2 (two) times daily.    [provider]  predniSONE (DELTASONE) 50 MG tablet Take 1 tablet (50 mg total) by mouth daily with breakfast. 11/24/15    Renae FickleShort, Mackenzie, MD  traMADol (ULTRAM) 50 MG tablet Take 1 tablet (50 mg total) by mouth every 12 (twelve) hours as needed for severe pain. 05/30/16   Tomasita Crumbleni, Adeleke, MD  Burr MedicoXULANE 150-35 MCG/24HR transdermal patch Place 1 each onto the skin once a week. 11/12/15   [provider]    Family History Family History  Problem Relation Age of Onset  . Hypertension Mother     Social History Social History   Tobacco Use  . Smoking status: Never Smoker  . Smokeless tobacco: Never Used  Substance Use Topics  . Alcohol use: Yes    Comment: socially   . Drug use: No     Allergies   Patient has no known allergies.   Review of Systems Review of Systems  Constitutional: Negative for fever.  HENT: Negative for congestion.   Eyes: Negative for photophobia.  Respiratory: Negative for shortness of breath.   Cardiovascular: Negative for chest pain.  Gastrointestinal: Negative for abdominal pain.  Musculoskeletal: Positive for arthralgias.  Neurological: Negative for tingling, weakness and numbness.  All other systems reviewed and are negative.    Physical Exam Updated Vital Signs BP 132/86 (BP Location: Left Arm)   Pulse 60   Temp 97.7 F (36.5 C) (Oral)   Resp 20   LMP 03/15/2017   SpO2 99%   Physical Exam  Constitutional:  She is oriented to person, place, and time. She appears well-developed and well-nourished. No distress.  HENT:  Head: Normocephalic and atraumatic.  Right Ear: External ear normal.  Left Ear: External ear normal.  Mouth/Throat: Oropharynx is clear and moist. No oropharyngeal exudate.  Eyes: Conjunctivae are normal. Pupils are equal, round, and reactive to light.  Neck: Normal range of motion. Neck supple.  Cardiovascular: Normal rate, regular rhythm, normal heart sounds and intact distal pulses.  Pulmonary/Chest: Effort normal and breath sounds normal. No stridor. She has no wheezes. She has no rales.  Abdominal: Soft. Bowel sounds are normal. She  exhibits no mass. There is no tenderness. There is no rebound and no guarding.  Musculoskeletal: Normal range of motion. She exhibits no deformity.       Right ankle: Normal. Achilles tendon normal.       Right foot: There is normal range of motion, no bony tenderness, no swelling, normal capillary refill, no crepitus, no deformity and no laceration.       Feet:  Neurological: She is alert and oriented to person, place, and time.  Skin: Skin is warm and dry. Capillary refill takes less than 2 seconds.  Psychiatric: She has a normal mood and affect.     ED Treatments / Results   Radiology Dg Foot Complete Right  Result Date: 04/16/2017 CLINICAL DATA:  Right foot pain after fall. Rolled foot stepping off a step this morning. Pain laterally. EXAM: RIGHT FOOT COMPLETE - 3+ VIEW COMPARISON:  None. FINDINGS: There is no evidence of fracture or dislocation. There is no evidence of arthropathy or other focal bone abnormality. Soft tissues are unremarkable. IMPRESSION: Negative radiograph of the right foot. Electronically Signed   By: Rubye Oaks M.D.   On: 04/16/2017 00:21    Procedures Procedures (including critical care time)  Medications Ordered in ED Medications  acetaminophen (TYLENOL) tablet 1,000 mg (1,000 mg Oral Given 04/16/17 0047)      Final Clinical Impressions(s) / ED Diagnoses   Final diagnoses:  Contusion of foot, unspecified laterality, initial encounter   Ice elevation NSAIDs  Return for weakness, numbness, changes in vision or speech,  fevers > 100.4 unrelieved by medication, shortness of breath, intractable vomiting, or diarrhea, abdominal pain, Inability to tolerate liquids or food, cough, altered mental status or any concerns. No signs of systemic illness or infection. The patient is nontoxic-appearing on exam and vital signs are within normal limits.    I have reviewed the triage vital signs and the nursing notes. Pertinent labs &imaging results that were  available during my care of the patient were reviewed by me and considered in my medical decision making (see chart for details).  After history, exam, and medical workup I feel the patient has been appropriately medically screened and is safe for discharge home. Pertinent diagnoses were discussed with the patient. Patient was given return precautions.   Shakelia Scrivner, MD 04/16/17 [phone]

## 2017-07-07 ENCOUNTER — Other Ambulatory Visit: Payer: Self-pay | Admitting: Podiatry

## 2017-07-07 ENCOUNTER — Ambulatory Visit: Payer: BLUE CROSS/BLUE SHIELD | Admitting: Podiatry

## 2017-07-07 ENCOUNTER — Ambulatory Visit (INDEPENDENT_AMBULATORY_CARE_PROVIDER_SITE_OTHER): Payer: BLUE CROSS/BLUE SHIELD

## 2017-07-07 ENCOUNTER — Encounter: Payer: Self-pay | Admitting: Podiatry

## 2017-07-07 VITALS — BP 107/68 | HR 76

## 2017-07-07 DIAGNOSIS — M7751 Other enthesopathy of right foot: Secondary | ICD-10-CM | POA: Diagnosis not present

## 2017-07-07 DIAGNOSIS — M79671 Pain in right foot: Secondary | ICD-10-CM

## 2017-07-07 DIAGNOSIS — M779 Enthesopathy, unspecified: Secondary | ICD-10-CM

## 2017-07-07 MED ORDER — TRIAMCINOLONE ACETONIDE 10 MG/ML IJ SUSP
10.0000 mg | Freq: Once | INTRAMUSCULAR | Status: AC
  Administered 2017-07-07: 10 mg

## 2017-07-07 MED ORDER — DICLOFENAC SODIUM 75 MG PO TBEC: 75.0000 mg | DELAYED_RELEASE_TABLET | Freq: Two times a day (BID) | ORAL | 2 refills | Status: AC

## 2017-07-09 NOTE — Progress Notes (Signed)
Subjective:   Patient ID: Kaylee Walter, female   DOB: 32 y.o.   MRN: [id number]   HPI Patient presents stating she is developed a lot of pain in the outside of the right foot and thinks she might have bumped it but is not sure and states is been hurting for around 3 months.  Patient does not smoke and likes to be active   Review of Systems  All other systems reviewed and are negative.       Objective:  Physical Exam  Constitutional: She appears well-developed and well-nourished.  Cardiovascular: Intact distal pulses.  Pulmonary/Chest: Effort normal.  Musculoskeletal: Normal range of motion.  Neurological: She is alert.  Skin: Skin is warm.  Nursing note and vitals reviewed.   Neurovascular status found to be intact with muscle strength adequate range of motion within normal limits.  Patient is noted to have quite a bit of discomfort in the outside of the right foot around the base of the fifth metatarsal with inflammation fluid upon palpation of the area.  Patient has good digital perfusion noted and is well oriented x3     Assessment:  Chronic discomfort base of fifth metatarsal right with probable peroneal tendinitis at insertion     Plan:  H&P conditions and x-rays reviewed with patient.  Today careful sheath injection administered 3 mg along 5 mg Xylocaine advised on ice therapy reduced activity supportive shoes and dispensed a brace to hold up the lateral side of the foot.  Reappoint to recheck again in 3 weeks and also place patient on diclofenac 75 mg twice daily for the next several weeks  X-ray indicates there is no signs of fracture of the base of the fifth metatarsal and it appears at this time to be inflammatory

## 2017-07-30 ENCOUNTER — Ambulatory Visit: Payer: BLUE CROSS/BLUE SHIELD | Admitting: Podiatry

## 2017-07-30 ENCOUNTER — Encounter: Payer: Self-pay | Admitting: Podiatry

## 2017-07-30 DIAGNOSIS — M779 Enthesopathy, unspecified: Secondary | ICD-10-CM

## 2017-07-30 NOTE — Progress Notes (Signed)
Subjective:   Patient ID: Kaylee Walter, female   DOB: 32 y.o.   MRN: [id number]   HPI Patient presents stating the brace is helping and the pain has reduced quite a bit on the foot   ROS      Objective:  Physical Exam  Neurovascular status intact with patient noted to have discomfort of a mild nature on the lateral aspect fifth metatarsal base that has improved but is present with palpation.     Assessment:  Tendinitis of the peroneal tendon as it comes underneath the fifth metatarsal right that has improved but is present     Plan:  Advised on ice therapy anti-inflammatories continued physical therapy and supportive shoes.  Reappoint as needed

## 2017-08-25 DIAGNOSIS — Z01411 Encounter for gynecological examination (general) (routine) with abnormal findings: Secondary | ICD-10-CM | POA: Diagnosis not present

## 2017-08-25 DIAGNOSIS — N760 Acute vaginitis: Secondary | ICD-10-CM | POA: Diagnosis not present

## 2017-08-26 DIAGNOSIS — J45909 Unspecified asthma, uncomplicated: Secondary | ICD-10-CM | POA: Diagnosis not present

## 2017-08-26 DIAGNOSIS — K5900 Constipation, unspecified: Secondary | ICD-10-CM | POA: Diagnosis not present

## 2017-08-26 DIAGNOSIS — J309 Allergic rhinitis, unspecified: Secondary | ICD-10-CM | POA: Diagnosis not present

## 2018-05-09 IMAGING — DX DG CHEST 1V PORT
1 series · 1 of 1 positions shown · non-contrast
Comparison: 11/20/2014

CLINICAL DATA: Shortness of breath, wheezing and chest tightness
for 1 day. History of asthma.

EXAM:
PORTABLE CHEST 1 VIEW

[chest ap]
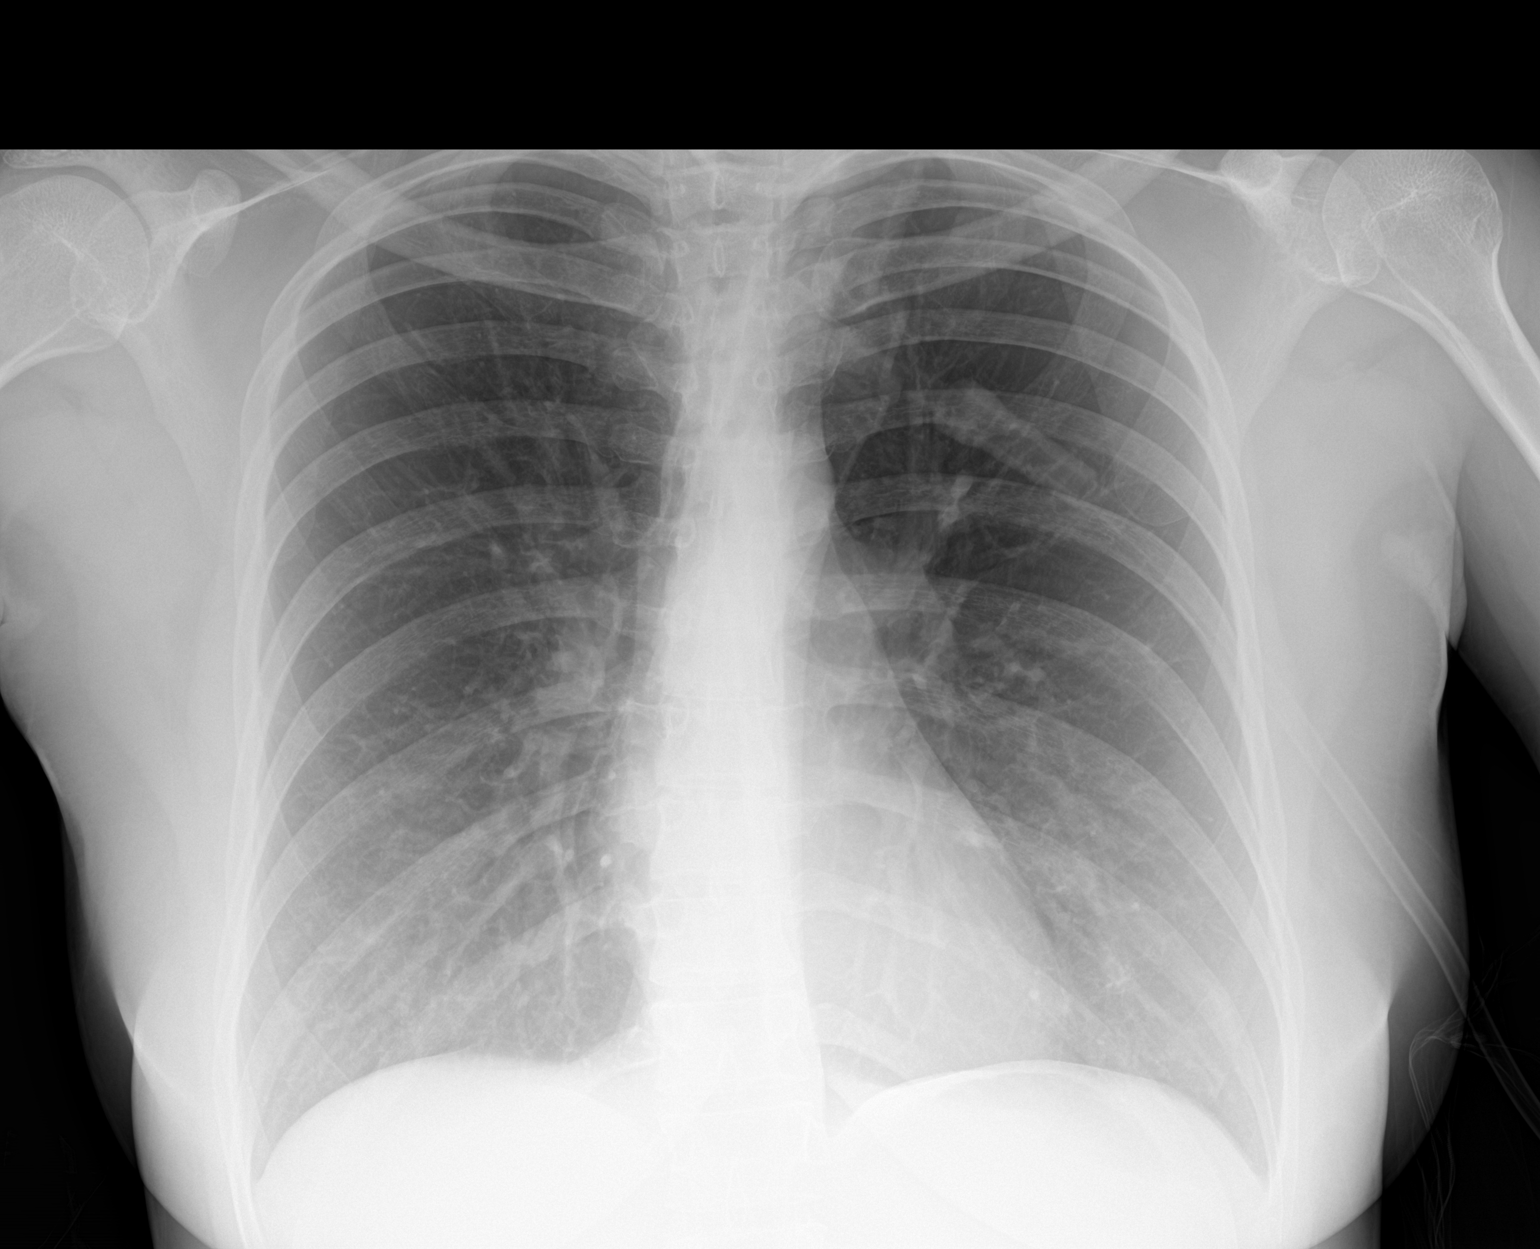

[1 of 1 positions shown; findings below may reference images not displayed]

FINDINGS: The cardiac silhouette, mediastinal and hilar contours are within
normal limits and stable. The lungs are clear. Mild hyperinflation.
No pleural effusion. The bony thorax is intact.
IMPRESSION: No acute cardiopulmonary findings.

## 2018-09-12 DIAGNOSIS — Z20828 Contact with and (suspected) exposure to other viral communicable diseases: Secondary | ICD-10-CM | POA: Diagnosis not present

## 2018-10-29 DIAGNOSIS — Z01419 Encounter for gynecological examination (general) (routine) without abnormal findings: Secondary | ICD-10-CM | POA: Diagnosis not present

## 2018-10-29 DIAGNOSIS — R102 Pelvic and perineal pain: Secondary | ICD-10-CM | POA: Diagnosis not present

## 2018-10-29 DIAGNOSIS — Z3045 Encounter for surveillance of transdermal patch hormonal contraceptive device: Secondary | ICD-10-CM | POA: Diagnosis not present

## 2018-10-29 DIAGNOSIS — Z3202 Encounter for pregnancy test, result negative: Secondary | ICD-10-CM | POA: Diagnosis not present

## 2018-10-29 DIAGNOSIS — N921 Excessive and frequent menstruation with irregular cycle: Secondary | ICD-10-CM | POA: Diagnosis not present

## 2018-11-03 DIAGNOSIS — R102 Pelvic and perineal pain: Secondary | ICD-10-CM | POA: Diagnosis not present

## 2018-11-03 DIAGNOSIS — N921 Excessive and frequent menstruation with irregular cycle: Secondary | ICD-10-CM | POA: Diagnosis not present

## 2018-11-05 DIAGNOSIS — R300 Dysuria: Secondary | ICD-10-CM | POA: Diagnosis not present

## 2019-10-01 IMAGING — CR DG FOOT COMPLETE 3+V*R*
3 series · 3 of 3 positions shown · non-contrast
Comparison: None.

CLINICAL DATA: Right foot pain after fall. Rolled foot stepping off
a step this morning. Pain laterally.

EXAM:
RIGHT FOOT COMPLETE - 3+ VIEW

[t foot ap right]
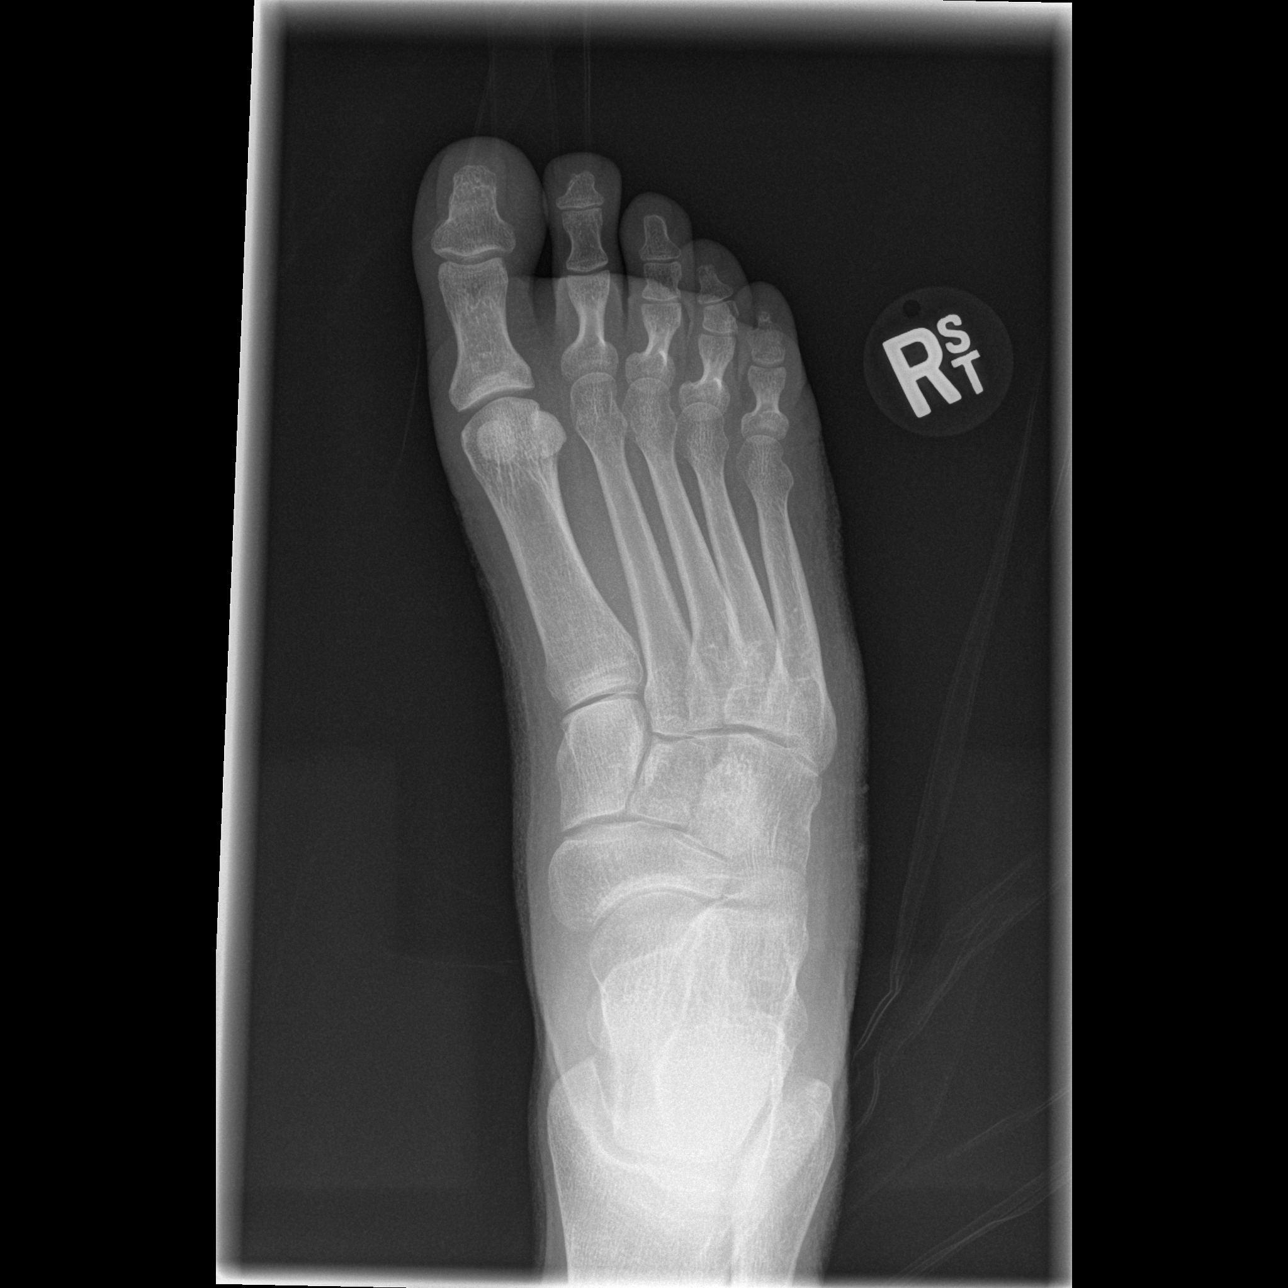

[t foot oblique right]
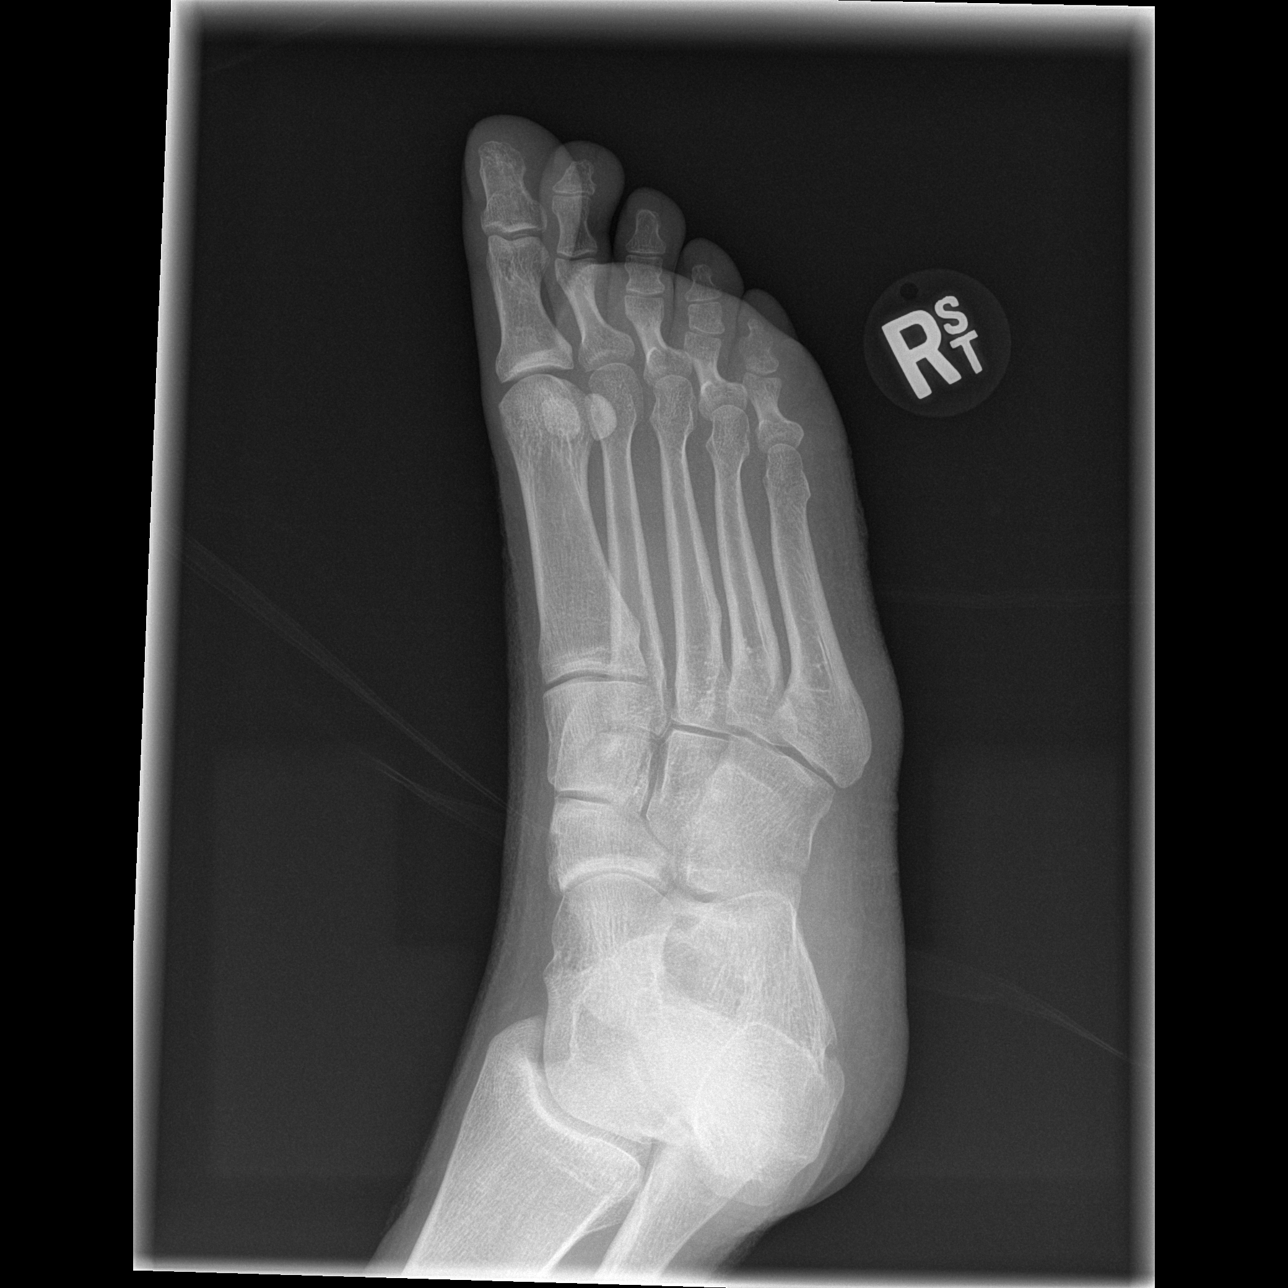

[t foot lat right]
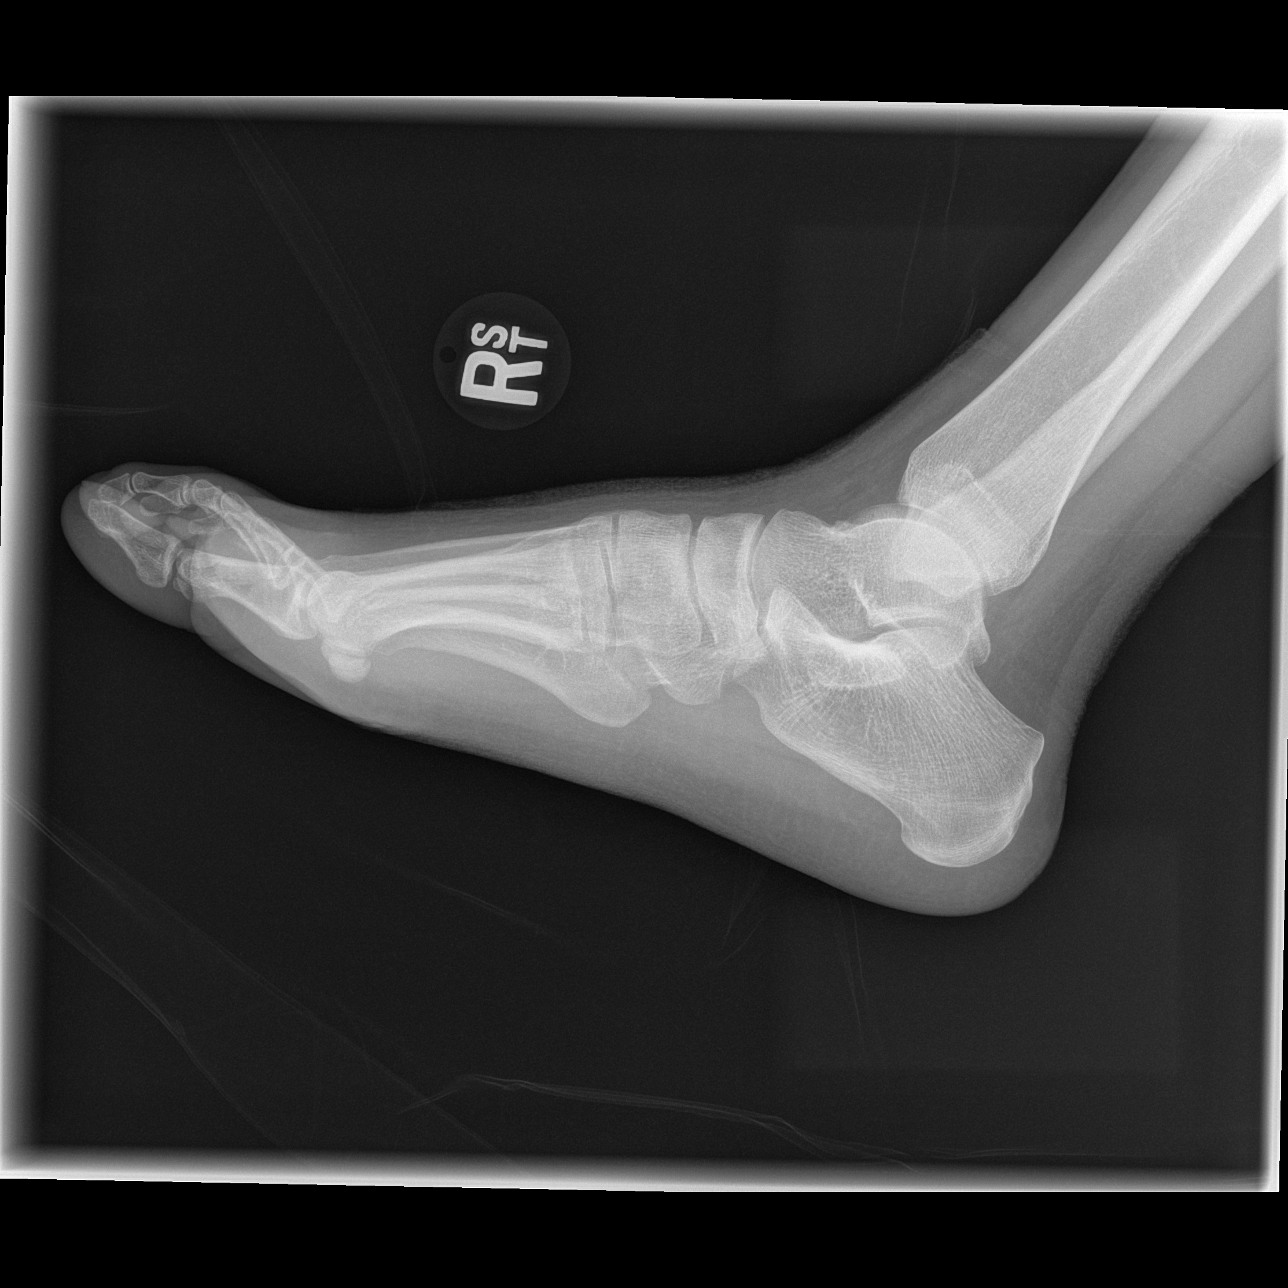

[3 of 3 positions shown; findings below may reference images not displayed]

FINDINGS: There is no evidence of fracture or dislocation. There is no
evidence of arthropathy or other focal bone abnormality. Soft
tissues are unremarkable.
IMPRESSION: Negative radiograph of the right foot.
